# Patient Record
Sex: Female | Born: 1955 | Race: Black or African American | Hispanic: No | Marital: Married | Smoking: Never smoker
Health system: Southern US, Community
[De-identification: ages and names within clinical notes are randomized; demographics above are authoritative.]

## PROBLEM LIST (undated history)

## (undated) DIAGNOSIS — I10 Essential (primary) hypertension: Secondary | ICD-10-CM

## (undated) DIAGNOSIS — L83 Acanthosis nigricans: Secondary | ICD-10-CM

## (undated) DIAGNOSIS — E785 Hyperlipidemia, unspecified: Secondary | ICD-10-CM

## (undated) DIAGNOSIS — K635 Polyp of colon: Secondary | ICD-10-CM

## (undated) DIAGNOSIS — E079 Disorder of thyroid, unspecified: Secondary | ICD-10-CM

## (undated) HISTORY — PX: COLONOSCOPY: SHX174

## (undated) HISTORY — DX: Acanthosis nigricans: L83

## (undated) HISTORY — DX: Hyperlipidemia, unspecified: E78.5

## (undated) HISTORY — PX: FOOT SURGERY: SHX648

## (undated) HISTORY — PX: TUBAL LIGATION: SHX77

## (undated) HISTORY — DX: Polyp of colon: K63.5

## (undated) HISTORY — DX: Essential (primary) hypertension: I10

## (undated) HISTORY — DX: Disorder of thyroid, unspecified: E07.9

---

## 1998-11-08 ENCOUNTER — Other Ambulatory Visit: Admission: RE | Admit: 1998-11-08 | Discharge: 1998-11-08 | Payer: Self-pay | Admitting: Obstetrics and Gynecology

## 2000-02-23 ENCOUNTER — Other Ambulatory Visit: Admission: RE | Admit: 2000-02-23 | Discharge: 2000-02-23 | Payer: Self-pay | Admitting: Obstetrics and Gynecology

## 2000-05-21 ENCOUNTER — Encounter: Admission: RE | Admit: 2000-05-21 | Discharge: 2000-05-21 | Payer: Self-pay | Admitting: Obstetrics and Gynecology

## 2000-05-21 ENCOUNTER — Encounter: Payer: Self-pay | Admitting: Obstetrics and Gynecology

## 2001-02-28 ENCOUNTER — Other Ambulatory Visit: Admission: RE | Admit: 2001-02-28 | Discharge: 2001-02-28 | Payer: Self-pay | Admitting: Obstetrics and Gynecology

## 2001-06-06 ENCOUNTER — Encounter: Admission: RE | Admit: 2001-06-06 | Discharge: 2001-06-06 | Payer: Self-pay | Admitting: Obstetrics and Gynecology

## 2001-06-06 ENCOUNTER — Encounter: Payer: Self-pay | Admitting: Obstetrics and Gynecology

## 2002-06-20 ENCOUNTER — Encounter: Admission: RE | Admit: 2002-06-20 | Discharge: 2002-06-20 | Payer: Self-pay | Admitting: Obstetrics and Gynecology

## 2002-06-20 ENCOUNTER — Encounter: Payer: Self-pay | Admitting: Obstetrics and Gynecology

## 2002-06-27 ENCOUNTER — Other Ambulatory Visit: Admission: RE | Admit: 2002-06-27 | Discharge: 2002-06-27 | Payer: Self-pay | Admitting: Obstetrics and Gynecology

## 2003-07-03 ENCOUNTER — Encounter: Payer: Self-pay | Admitting: Obstetrics and Gynecology

## 2003-07-03 ENCOUNTER — Other Ambulatory Visit: Admission: RE | Admit: 2003-07-03 | Discharge: 2003-07-03 | Payer: Self-pay | Admitting: Obstetrics and Gynecology

## 2003-07-03 ENCOUNTER — Encounter: Admission: RE | Admit: 2003-07-03 | Discharge: 2003-07-03 | Payer: Self-pay | Admitting: Obstetrics and Gynecology

## 2004-05-28 ENCOUNTER — Emergency Department (HOSPITAL_COMMUNITY): Admission: EM | Admit: 2004-05-28 | Discharge: 2004-05-28 | Payer: Self-pay | Admitting: Emergency Medicine

## 2004-07-11 ENCOUNTER — Encounter: Admission: RE | Admit: 2004-07-11 | Discharge: 2004-07-11 | Payer: Self-pay | Admitting: Obstetrics and Gynecology

## 2005-08-02 ENCOUNTER — Encounter: Admission: RE | Admit: 2005-08-02 | Discharge: 2005-08-02 | Payer: Self-pay | Admitting: Obstetrics and Gynecology

## 2006-08-07 ENCOUNTER — Encounter: Admission: RE | Admit: 2006-08-07 | Discharge: 2006-08-07 | Payer: Self-pay | Admitting: Obstetrics and Gynecology

## 2007-08-12 ENCOUNTER — Encounter: Admission: RE | Admit: 2007-08-12 | Discharge: 2007-08-12 | Payer: Self-pay | Admitting: Obstetrics and Gynecology

## 2008-01-16 ENCOUNTER — Ambulatory Visit: Payer: Self-pay | Admitting: Internal Medicine

## 2008-02-03 ENCOUNTER — Ambulatory Visit: Payer: Self-pay | Admitting: Internal Medicine

## 2008-02-03 ENCOUNTER — Encounter: Payer: Self-pay | Admitting: Internal Medicine

## 2008-08-17 ENCOUNTER — Encounter: Admission: RE | Admit: 2008-08-17 | Discharge: 2008-08-17 | Payer: Self-pay | Admitting: Endocrinology

## 2009-08-18 ENCOUNTER — Encounter: Admission: RE | Admit: 2009-08-18 | Discharge: 2009-08-18 | Payer: Self-pay | Admitting: Endocrinology

## 2009-09-07 DIAGNOSIS — E039 Hypothyroidism, unspecified: Secondary | ICD-10-CM | POA: Insufficient documentation

## 2009-09-07 DIAGNOSIS — E785 Hyperlipidemia, unspecified: Secondary | ICD-10-CM | POA: Insufficient documentation

## 2010-08-22 ENCOUNTER — Encounter: Admission: RE | Admit: 2010-08-22 | Discharge: 2010-08-22 | Payer: Self-pay | Admitting: Endocrinology

## 2011-03-27 DIAGNOSIS — I10 Essential (primary) hypertension: Secondary | ICD-10-CM | POA: Insufficient documentation

## 2011-07-31 ENCOUNTER — Other Ambulatory Visit: Payer: Self-pay | Admitting: Endocrinology

## 2011-07-31 DIAGNOSIS — Z1231 Encounter for screening mammogram for malignant neoplasm of breast: Secondary | ICD-10-CM

## 2011-08-28 ENCOUNTER — Ambulatory Visit
Admission: RE | Admit: 2011-08-28 | Discharge: 2011-08-28 | Disposition: A | Discharge status: Home / Self Care | Payer: 59 | Source: Ambulatory Visit | Attending: Endocrinology | Admitting: Endocrinology

## 2011-08-28 DIAGNOSIS — Z1231 Encounter for screening mammogram for malignant neoplasm of breast: Secondary | ICD-10-CM

## 2012-08-06 ENCOUNTER — Other Ambulatory Visit: Payer: Self-pay | Admitting: Endocrinology

## 2012-08-06 DIAGNOSIS — Z1231 Encounter for screening mammogram for malignant neoplasm of breast: Secondary | ICD-10-CM

## 2012-09-02 ENCOUNTER — Ambulatory Visit
Admission: RE | Admit: 2012-09-02 | Discharge: 2012-09-02 | Disposition: A | Discharge status: Home / Self Care | Payer: 59 | Source: Ambulatory Visit | Attending: Endocrinology | Admitting: Endocrinology

## 2012-09-02 DIAGNOSIS — Z1231 Encounter for screening mammogram for malignant neoplasm of breast: Secondary | ICD-10-CM

## 2012-12-11 ENCOUNTER — Encounter: Payer: Self-pay | Admitting: Internal Medicine

## 2013-01-16 ENCOUNTER — Ambulatory Visit (AMBULATORY_SURGERY_CENTER): Payer: 59 | Admitting: *Deleted

## 2013-01-16 VITALS — Ht 67.0 in | Wt 248.0 lb

## 2013-01-16 DIAGNOSIS — Z860100 Personal history of colon polyps, unspecified: Secondary | ICD-10-CM

## 2013-01-16 DIAGNOSIS — Z1211 Encounter for screening for malignant neoplasm of colon: Secondary | ICD-10-CM

## 2013-01-16 MED ORDER — PEG-KCL-NACL-NASULF-NA ASC-C 100 G PO SOLR: ORAL | Status: DC

## 2013-01-16 NOTE — Progress Notes (Signed)
No egg or soy allergy 

## 2013-01-28 ENCOUNTER — Encounter: Payer: Self-pay | Admitting: Internal Medicine

## 2013-01-31 ENCOUNTER — Encounter: Payer: Self-pay | Admitting: Internal Medicine

## 2013-01-31 ENCOUNTER — Encounter: Payer: 59 | Admitting: Internal Medicine

## 2013-01-31 ENCOUNTER — Ambulatory Visit (AMBULATORY_SURGERY_CENTER): Payer: 59 | Admitting: Internal Medicine

## 2013-01-31 VITALS — BP 119/86 | HR 71 | Temp 97.0°F | Resp 22 | Ht 67.0 in | Wt 248.0 lb

## 2013-01-31 DIAGNOSIS — Z86010 Personal history of colonic polyps: Secondary | ICD-10-CM

## 2013-01-31 DIAGNOSIS — Z1211 Encounter for screening for malignant neoplasm of colon: Secondary | ICD-10-CM

## 2013-01-31 MED ORDER — SODIUM CHLORIDE 0.9 % IV SOLN: 500.0000 mL | INTRAVENOUS | Status: DC

## 2013-01-31 NOTE — Progress Notes (Signed)
Patient did not experience any of the following events: a burn prior to discharge; a fall within the facility; wrong site/side/patient/procedure/implant event; or a hospital transfer or hospital admission upon discharge from the facility. (G8907) Patient did not have preoperative order for IV antibiotic SSI prophylaxis. (G8918)  

## 2013-01-31 NOTE — Op Note (Signed)
[facility] [address] [place], [address]   COLONOSCOPY PROCEDURE REPORT  PATIENT: Megan Bowers, Megan Bowers  MR#: [id number] BIRTHDATE: January 25, 1956 , 56  yrs. old GENDER: Female ENDOSCOPIST: Roxy Cedar, MD REFERRED GN:[id number] Program Recall PROCEDURE DATE:  01/31/2013 PROCEDURE:   Colonoscopy, surveillance ASA CLASS:   Class II INDICATIONS:Patient's personal history of adenomatous colon polyps. Index 01-2008 (small TA) MEDICATIONS: MAC sedation, administered by CRNA and propofol (Diprivan) 260mg  IV  DESCRIPTION OF PROCEDURE:   After the risks benefits and alternatives of the procedure were thoroughly explained, informed consent was obtained.  A digital rectal exam revealed no abnormalities of the rectum.   The LB CF-H180AL [id number] and LB CF-Q180AL [id number]  endoscope was introduced through the anus and advanced to the cecum, which was identified by both the appendix and ileocecal valve. No adverse events experienced.   The quality of the prep was adequate, using MoviPrep  The instrument was then slowly withdrawn as the colon was fully examined.      COLON FINDINGS: A normal appearing cecum, ileocecal valve, and appendiceal orifice were identified.  The ascending, hepatic flexure, transverse, splenic flexure, descending, sigmoid colon and rectum appeared unremarkable.  No polyps or cancers were seen. Retroflexed views revealed internal hemorrhoids. The time to cecum=3 minutes 52 seconds.  Withdrawal time=11 minutes 41 seconds. The scope was withdrawn and the procedure completed. COMPLICATIONS: There were no complications.  ENDOSCOPIC IMPRESSION: 1. Normal colon  RECOMMENDATIONS: 1. Continue current colorectal screening recommendations  with a repeat colonoscopy in 10 years.   eSigned:  Roxy Cedar, MD 01/31/2013 9:11 AM   cc: Adrian Prince, MD and The Patient

## 2013-01-31 NOTE — Patient Instructions (Addendum)
YOU HAD AN ENDOSCOPIC PROCEDURE TODAY AT THE [facility]: Refer to the procedure report that was given to you for any specific questions about what was found during the examination.  If the procedure report does not answer your questions, please call your gastroenterologist to clarify.  If you requested that your care partner not be given the details of your procedure findings, then the procedure report has been included in a sealed envelope for you to review at your convenience later.  YOU SHOULD EXPECT: Some feelings of bloating in the abdomen. Passage of more gas than usual.  Walking can help get rid of the air that was put into your GI tract during the procedure and reduce the bloating. If you had a lower endoscopy (such as a colonoscopy or flexible sigmoidoscopy) you may notice spotting of blood in your stool or on the toilet paper. If you underwent a bowel prep for your procedure, then you may not have a normal bowel movement for a few days.  DIET: Your first meal following the procedure should be a light meal and then it is ok to progress to your normal diet.  A half-sandwich or bowl of soup is an example of a good first meal.  Heavy or fried foods are harder to digest and may make you feel nauseous or bloated.  Likewise meals heavy in dairy and vegetables can cause extra gas to form and this can also increase the bloating.  Drink plenty of fluids but you should avoid alcoholic beverages for 24 hours.  ACTIVITY: Your care partner should take you home directly after the procedure.  You should plan to take it easy, moving slowly for the rest of the day.  You can resume normal activity the day after the procedure however you should NOT DRIVE or use heavy machinery for 24 hours (because of the sedation medicines used during the test).    SYMPTOMS TO REPORT IMMEDIATELY: A gastroenterologist can be reached at any hour.  During normal business hours, 8:30 AM to 5:00 PM Monday through Friday,  call [phone].  After hours and on weekends, please call the GI answering service at [phone] who will take a message and have the physician on call contact you.   Following lower endoscopy (colonoscopy or flexible sigmoidoscopy):  Excessive amounts of blood in the stool  Significant tenderness or worsening of abdominal pains  Swelling of the abdomen that is new, acute  Fever of 100F or higher  FOLLOW UP: Our staff will call the home number listed on your records the next business day following your procedure to check on you and address any questions or concerns that you may have at that time regarding the information given to you following your procedure. This is a courtesy call and so if there is no answer at the home number and we have not heard from you through the emergency physician on call, we will assume that you have returned to your regular daily activities without incident.  SIGNATURES/CONFIDENTIALITY: You and/or your care partner have signed paperwork which will be entered into your electronic medical record.  These signatures attest to the fact that that the information above on your After Visit Summary has been reviewed and is understood.  Full responsibility of the confidentiality of this discharge information lies with you and/or your care-partner.  Continue your usual medications  Follow up in 10 years

## 2013-02-03 ENCOUNTER — Telehealth: Payer: Self-pay | Admitting: *Deleted

## 2013-02-03 NOTE — Telephone Encounter (Signed)
  Follow up Call-  Call back number 01/31/2013  Post procedure Call Back phone  # [phone]  Permission to leave phone message Yes     Patient questions:  Do you have a fever, pain , or abdominal swelling? no Pain Score  0 *  Have you tolerated food without any problems? yes  Have you been able to return to your normal activities? yes  Do you have any questions about your discharge instructions: Diet   no Medications  no Follow up visit  no  Do you have questions or concerns about your Care? no  Actions: * If pain score is 4 or above: No action needed, pain <4.

## 2013-05-20 ENCOUNTER — Encounter: Payer: Self-pay | Admitting: Podiatry

## 2013-05-20 ENCOUNTER — Ambulatory Visit (INDEPENDENT_AMBULATORY_CARE_PROVIDER_SITE_OTHER): Payer: 59 | Admitting: Podiatry

## 2013-05-20 DIAGNOSIS — M7660 Achilles tendinitis, unspecified leg: Secondary | ICD-10-CM

## 2013-05-20 DIAGNOSIS — M7662 Achilles tendinitis, left leg: Secondary | ICD-10-CM

## 2013-05-20 DIAGNOSIS — L84 Corns and callosities: Secondary | ICD-10-CM

## 2013-05-20 NOTE — Progress Notes (Signed)
Subjective: 57 year old female presents complaining of left heel pain and problem with callus on right foot. She is using 56 year old LRB orthotics. Patient stands on feet all day at work.  Objective: Callus under the 2nd and 3rd MPJ right. Pain and tenderness over posterio-inferior aspect of left heel after been on feet.  No acute edema or erythema noted. No pain with range of motion. All neurovascular status are within normal. Positive for mild tightness of Achilles tendon bilaterial.   Subjective: Achilles tendonitis left heel. Plantar callus right under 2nd and 3rd MPJ.  Plan: Calluses trimmed. Both Orthotics have worn out at plantar lateral surface. Added with varus posting with 1/4" felt pad at plantar lateral wedge of the heel.  If this is not effective, will try new pair of Orthotics.

## 2013-08-07 ENCOUNTER — Other Ambulatory Visit: Payer: Self-pay

## 2013-08-07 DIAGNOSIS — Z1231 Encounter for screening mammogram for malignant neoplasm of breast: Secondary | ICD-10-CM

## 2013-09-04 ENCOUNTER — Ambulatory Visit: Payer: 59

## 2013-09-29 ENCOUNTER — Ambulatory Visit
Admission: RE | Admit: 2013-09-29 | Discharge: 2013-09-29 | Disposition: A | Discharge status: Home / Self Care | Payer: 59 | Source: Ambulatory Visit

## 2013-09-29 DIAGNOSIS — Z1231 Encounter for screening mammogram for malignant neoplasm of breast: Secondary | ICD-10-CM

## 2014-10-13 ENCOUNTER — Other Ambulatory Visit: Payer: Self-pay

## 2014-10-13 DIAGNOSIS — Z1231 Encounter for screening mammogram for malignant neoplasm of breast: Secondary | ICD-10-CM

## 2014-10-19 ENCOUNTER — Ambulatory Visit: Payer: 59

## 2014-10-19 ENCOUNTER — Ambulatory Visit
Admission: RE | Admit: 2014-10-19 | Discharge: 2014-10-19 | Disposition: A | Discharge status: Home / Self Care | Payer: No Typology Code available for payment source | Source: Ambulatory Visit

## 2014-10-19 DIAGNOSIS — Z1231 Encounter for screening mammogram for malignant neoplasm of breast: Secondary | ICD-10-CM

## 2015-09-20 ENCOUNTER — Ambulatory Visit (INDEPENDENT_AMBULATORY_CARE_PROVIDER_SITE_OTHER): Payer: PRIVATE HEALTH INSURANCE | Admitting: Podiatry

## 2015-09-20 ENCOUNTER — Encounter: Payer: Self-pay | Admitting: Podiatry

## 2015-09-20 VITALS — Ht 66.5 in | Wt 240.0 lb

## 2015-09-20 DIAGNOSIS — M7660 Achilles tendinitis, unspecified leg: Secondary | ICD-10-CM

## 2015-09-20 DIAGNOSIS — M21969 Unspecified acquired deformity of unspecified lower leg: Secondary | ICD-10-CM

## 2015-09-20 DIAGNOSIS — M216X9 Other acquired deformities of unspecified foot: Secondary | ICD-10-CM

## 2015-09-20 DIAGNOSIS — M7662 Achilles tendinitis, left leg: Secondary | ICD-10-CM

## 2015-09-20 MED ORDER — LIDOCAINE 5 % EX PTCH: 1.0000 | MEDICATED_PATCH | CUTANEOUS | Status: DC

## 2015-09-20 NOTE — Progress Notes (Signed)
Subjective: 59 year old female presents complaining of pain at the back of left heel, inferior, posterior medial  x 9 months.  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   Objective: Dermatologic: No open skin lesions. No edema or erythema at the affected area. Neurovascular status are within normal other than localized posterior heel pain left. Orthopedic:  Hypermobile first ray with forefoot varus, and STJ hyperpronation with weight bearing bilateral.  Pain in left posterior heel.  X-ray: AP view show very short first ray (-10)/ long 2nd ray bilateral.  Positive of elevated first ray in lateral view. Post surgical internal fixation screw 3rd metatarsal bilateral.   Assessment: Achilles tendonitis left. Hypermobile first ray with forefoot varus bilateral. STJ hyperpronation bilateral.   Plan: Reviewed clinical findings and available treatment options. Both feet casted for orthotics. Rx. Lidoderm patch to use as needed.

## 2015-09-20 NOTE — Patient Instructions (Signed)
Seen for pain in left posterior heel.  Lidoderm patch prescribed. Need custom orthotics.

## 2015-09-21 ENCOUNTER — Inpatient Hospital Stay: Admission: RE | Admit: 2015-09-21 | Payer: Self-pay | Source: Ambulatory Visit

## 2015-09-21 ENCOUNTER — Other Ambulatory Visit: Payer: Self-pay

## 2015-09-21 DIAGNOSIS — M21969 Unspecified acquired deformity of unspecified lower leg: Secondary | ICD-10-CM | POA: Insufficient documentation

## 2015-09-21 DIAGNOSIS — M216X9 Other acquired deformities of unspecified foot: Secondary | ICD-10-CM | POA: Insufficient documentation

## 2015-09-22 ENCOUNTER — Telehealth: Payer: Self-pay | Admitting: *Deleted

## 2015-09-22 MED ORDER — DICLOFENAC SODIUM 1 % TD GEL: 2.0000 g | Freq: Four times a day (QID) | TRANSDERMAL | Status: DC

## 2015-09-22 NOTE — Telephone Encounter (Signed)
09/22/2015 Dr. Caffie Pinto, Verdene Lennert called and said she couldn't get any of the pain cream /patch. They wouldn't break a box to give her any, can you give her anything else she said.

## 2015-10-05 ENCOUNTER — Other Ambulatory Visit: Payer: Self-pay

## 2015-10-05 DIAGNOSIS — Z1231 Encounter for screening mammogram for malignant neoplasm of breast: Secondary | ICD-10-CM

## 2015-11-11 ENCOUNTER — Ambulatory Visit
Admission: RE | Admit: 2015-11-11 | Discharge: 2015-11-11 | Disposition: A | Discharge status: Home / Self Care | Payer: No Typology Code available for payment source | Source: Ambulatory Visit

## 2015-11-11 DIAGNOSIS — Z1231 Encounter for screening mammogram for malignant neoplasm of breast: Secondary | ICD-10-CM

## 2016-10-17 ENCOUNTER — Other Ambulatory Visit: Payer: Self-pay | Admitting: Endocrinology

## 2016-10-17 DIAGNOSIS — Z1231 Encounter for screening mammogram for malignant neoplasm of breast: Secondary | ICD-10-CM

## 2016-11-16 ENCOUNTER — Ambulatory Visit: Payer: No Typology Code available for payment source

## 2016-11-21 ENCOUNTER — Ambulatory Visit
Admission: RE | Admit: 2016-11-21 | Discharge: 2016-11-21 | Disposition: A | Discharge status: Home / Self Care | Payer: No Typology Code available for payment source | Source: Ambulatory Visit | Attending: Endocrinology | Admitting: Endocrinology

## 2016-11-21 DIAGNOSIS — Z1231 Encounter for screening mammogram for malignant neoplasm of breast: Secondary | ICD-10-CM

## 2017-07-17 ENCOUNTER — Ambulatory Visit (INDEPENDENT_AMBULATORY_CARE_PROVIDER_SITE_OTHER): Payer: No Typology Code available for payment source | Admitting: Internal Medicine

## 2017-07-17 ENCOUNTER — Encounter: Payer: Self-pay | Admitting: Internal Medicine

## 2017-07-17 VITALS — BP 128/72 | HR 84 | Ht 66.5 in | Wt 236.0 lb

## 2017-07-17 DIAGNOSIS — Z86010 Personal history of colonic polyps: Secondary | ICD-10-CM | POA: Diagnosis not present

## 2017-07-17 DIAGNOSIS — R1310 Dysphagia, unspecified: Secondary | ICD-10-CM

## 2017-07-17 DIAGNOSIS — K219 Gastro-esophageal reflux disease without esophagitis: Secondary | ICD-10-CM | POA: Diagnosis not present

## 2017-07-17 DIAGNOSIS — K8070 Calculus of gallbladder and bile duct without cholecystitis without obstruction: Secondary | ICD-10-CM

## 2017-07-17 NOTE — Progress Notes (Signed)
HISTORY OF PRESENT ILLNESS:  Megan Bowers is a 61 y.o. female with past medical history as listed below who is referred today by her primary care provider Dr. Forde Dandy with chief complaint of reflux disease with significant regurgitation and mild dysphagia. The patient was last seen in this facility May 2014 when she underwent surveillance colonoscopy for history of diminutive colonic adenoma. Examination was normal. Follow-up in 10 years recommended. She states she was in her usual state of health until approximately 5 months ago when she began to develop problems with significant regurgitation. Symptoms were worse late evening. Occasional mild indigestion as well. She does mention some very mild intermittent solid food dysphagia item such as apples with skin. She was evaluated by Dr. Forde Dandy in August and placed on over-the-counter omeprazole for approximately 3 weeks. Symptoms seemed to improve. Minimal recurrence after coming off PPI. She presents now for evaluation. Patient has had steady weight gain over time. Current BMI 37.52. GI review of systems is otherwise negative. Review of outside laboratories from 04/03/2017 finds a unremarkable CBC and comprehensive metabolic panel. Hemoglobin 13.6. Previous radiology August 2005 for right-sided abdominal pain reveals cholelithiasis and hepatomegaly  REVIEW OF SYSTEMS:  All non-GI ROS negative entirely without exception  Past Medical History:  Diagnosis Date  . Acanthosis nigricans   . Colon polyps   . Hyperlipidemia   . Hypertension   . Thyroid disease    hypothryroid    Past Surgical History:  Procedure Laterality Date  . COLONOSCOPY    . FOOT SURGERY    . TUBAL LIGATION      Social History Megan Bowers  reports that she has never smoked. She has never used smokeless tobacco. She reports that she does not drink alcohol or use drugs.  family history is not on file.  No Known Allergies     PHYSICAL EXAMINATION: Vital  signs: BP 128/72   Pulse 84   Ht 5' 6.5" (1.689 m)   Wt 236 lb (107 kg)   BMI 37.52 kg/m   Constitutional: generally well-appearing, no acute distress Psychiatric: alert and oriented x3, cooperative Eyes: extraocular movements intact, anicteric, conjunctiva pink Mouth: oral pharynx moist, no lesions. No thrush Neck: suppleWithout thyromegaly Lymph: no supraclavicular lymphadenopathy Cardiovascular: heart regular rate and rhythm, no murmur Lungs: clear to auscultation bilaterally Abdomen: soft, obese, nontender, nondistended, no obvious ascites, no peritoneal signs, normal bowel sounds, no organomegaly. No succussion splash Rectal:Omitted Extremities: no clubbing, cyanosis, or lower extremity edema bilaterally Skin: no lesions on visible extremities Neuro: No focal deficits. Cranial nerves intact  ASSESSMENT:  #1. GERD with significant regurgitant component #2. Mild intermittent solid food dysphagia. Rule out peptic stricture #3. Morbid obesity #4. History of diminutive tubular adenoma on screening colonoscopy. Surveillance up-to-date #5. Cholelithiasis on ultrasound  PLAN:  #1. Reflux precautions with attention to weight loss. Reviewed #2. Literature provided and reviewed on GERD #3. PPI on demand, such as omeprazole 20 mg daily #4. Upper endoscopy with possible esophageal dilation.The nature of the procedure, as well as the risks, benefits, and alternatives were carefully and thoroughly reviewed with the patient. Ample time for discussion and questions allowed. The patient understood, was satisfied, and agreed to proceed. #5. Surveillance colonoscopy around 2024 #6. Ongoing general medical care with Dr. Forde Dandy  A copy of this consultation note has been sent to Dr. Forde Dandy

## 2017-07-17 NOTE — Patient Instructions (Signed)

## 2017-09-04 ENCOUNTER — Encounter: Payer: Self-pay | Admitting: Internal Medicine

## 2017-09-04 ENCOUNTER — Other Ambulatory Visit: Payer: Self-pay

## 2017-09-04 ENCOUNTER — Ambulatory Visit (AMBULATORY_SURGERY_CENTER): Payer: No Typology Code available for payment source | Admitting: Internal Medicine

## 2017-09-04 VITALS — BP 140/84 | HR 67 | Temp 96.8°F | Resp 12 | Ht 66.5 in | Wt 236.0 lb

## 2017-09-04 DIAGNOSIS — K253 Acute gastric ulcer without hemorrhage or perforation: Secondary | ICD-10-CM | POA: Diagnosis not present

## 2017-09-04 DIAGNOSIS — R1310 Dysphagia, unspecified: Secondary | ICD-10-CM | POA: Diagnosis present

## 2017-09-04 DIAGNOSIS — K219 Gastro-esophageal reflux disease without esophagitis: Secondary | ICD-10-CM

## 2017-09-04 DIAGNOSIS — K2100 Gastro-esophageal reflux disease with esophagitis, without bleeding: Secondary | ICD-10-CM

## 2017-09-04 MED ORDER — SODIUM CHLORIDE 0.9 % IV SOLN: 500.0000 mL | INTRAVENOUS | Status: DC

## 2017-09-04 MED ORDER — OMEPRAZOLE 20 MG PO CPDR: 20.0000 mg | DELAYED_RELEASE_CAPSULE | Freq: Every day | ORAL | 11 refills | Status: DC

## 2017-09-04 NOTE — Patient Instructions (Signed)
YOU HAD AN ENDOSCOPIC PROCEDURE TODAY AT [facility]:   Refer to the procedure report that was given to you for any specific questions about what was found during the examination.  If the procedure report does not answer your questions, please call your gastroenterologist to clarify.  If you requested that your care partner not be given the details of your procedure findings, then the procedure report has been included in a sealed envelope for you to review at your convenience later.  YOU SHOULD EXPECT: Some feelings of bloating in the abdomen. Passage of more gas than usual.  Walking can help get rid of the air that was put into your GI tract during the procedure and reduce the bloating. If you had a lower endoscopy (such as a colonoscopy or flexible sigmoidoscopy) you may notice spotting of blood in your stool or on the toilet paper. If you underwent a bowel prep for your procedure, you may not have a normal bowel movement for a few days.  Please Note:  You might notice some irritation and congestion in your nose or some drainage.  This is from the oxygen used during your procedure.  There is no need for concern and it should clear up in a day or so.  SYMPTOMS TO REPORT IMMEDIATELY:   Following upper endoscopy (EGD)  Vomiting of blood or coffee ground material  New chest pain or pain under the shoulder blades  Painful or persistently difficult swallowing  New shortness of breath  Fever of 100F or higher  Black, tarry-looking stools  For urgent or emergent issues, a gastroenterologist can be reached at any hour by calling [phone].   DIET:  We do recommend a small meal at first, but then you may proceed to your regular diet.  Drink plenty of fluids but you should avoid alcoholic beverages for 24 hours.  MEDICATIONS: Start Omeprazole 20 mg by mouth daily (30 tablets with 11 refills RX sent to your pharmacy). Please stay on your other current medications until your follow  up with Dr. Henrene Pastor in about 3 months.  FOLLOW: Please call and schedule an appointment to follow up with Dr. Henrene Pastor in about 3 months.  Please see handouts given to you by your recovery nurse.  ACTIVITY:  You should plan to take it easy for the rest of today and you should NOT DRIVE or use heavy machinery until tomorrow (because of the sedation medicines used during the test).    FOLLOW UP: Our staff will call the number listed on your records the next business day following your procedure to check on you and address any questions or concerns that you may have regarding the information given to you following your procedure. If we do not reach you, we will leave a message.  However, if you are feeling well and you are not experiencing any problems, there is no need to return our call.  We will assume that you have returned to your regular daily activities without incident.  If any biopsies were taken you will be contacted by phone or by letter within the next 1-3 weeks.  Please call us at [phone] if you have not heard about the biopsies in 3 weeks.   Thank you for allowing [place] to provide for your healthcare needs today.  SIGNATURES/CONFIDENTIALITY: You and/or your care partner have signed paperwork which will be entered into your electronic medical record.  These signatures attest to the fact that that the information above on  your After Visit Summary has been reviewed and is understood.  Full responsibility of the confidentiality of this discharge information lies with you and/or your care-partner. 

## 2017-09-04 NOTE — Op Note (Signed)
[facility] Patient Name: Megan Bowers Procedure Date: 09/04/2017 10:03 AM MRN: [id number] Endoscopist: Docia Chuck. Henrene Pastor , MD Age: 61 Referring MD:  Date of Birth: 03-Jun-1956 Gender: Female Account #: [id number] Procedure:                Upper GI endoscopy, with biopsies Indications:              Dysphagia, Esophageal reflux, Regurgitation Medicines:                Monitored Anesthesia Care Procedure:                Pre-Anesthesia Assessment:                           - Prior to the procedure, a History and Physical                            was performed, and patient medications and                            allergies were reviewed. The patient's tolerance of                            previous anesthesia was also reviewed. The risks                            and benefits of the procedure and the sedation                            options and risks were discussed with the patient.                            All questions were answered, and informed consent                            was obtained. Prior Anticoagulants: The patient has                            taken no previous anticoagulant or antiplatelet                            agents. ASA Grade Assessment: II - A patient with                            mild systemic disease. After reviewing the risks                            and benefits, the patient was deemed in                            satisfactory condition to undergo the procedure.                           After obtaining informed consent, the endoscope was  passed under direct vision. Throughout the                            procedure, the patient's blood pressure, pulse, and                            oxygen saturations were monitored continuously. The                            Endoscope was introduced through the mouth, and                            advanced to the second part of duodenum. The upper           GI endoscopy was accomplished without difficulty.                            The patient tolerated the procedure well. Scope In: Scope Out: Findings:                 LA Grade A (one or more mucosal breaks less than 5                            mm, not extending between tops of 2 mucosal folds)                            esophagitis was found.                           The esophagus was otherwise normal.                           Multiple small erosions were found in the                            prepyloric region of the stomach. Biopsies were                            taken with a cold forceps for Helicobacter pylori                            testing using CLOtest.                           The stomach was otherwise normal.                           The examined duodenum was normal.                           The cardia and gastric fundus were normal on                            retroflexion. Complications:            No immediate complications. Estimated Blood Loss:     Estimated  blood loss: none. Impression:               1. GERD with endoscopic evidence of esophagitis                            (mild) with erosion and edema                           2. Prepyloric erosions. Status post CLO biopsy                           3. Otherwise normal EGD. Recommendation:           1. Reflux precautions. Please review the                            information provided by endoscopy nurse                           2. Weight loss. This will help your problems with                            regurgitation                           3. Prescribe omeprazole 20 mg daily; #30; 11 refills                           4. Follow-up CLO biopsy and treat if positive                           5. Please make follow-up office appointment with                            Dr. Henrene Pastor in about 3 months. Stay on your                            medication until that time Docia Chuck. Henrene Pastor, MD 09/04/2017 10:27:38  AM This report has been signed electronically.

## 2017-09-04 NOTE — Progress Notes (Signed)
Called to room to assist during endoscopic procedure.  Patient ID and intended procedure confirmed with present staff. Received instructions for my participation in the procedure from the performing physician.  

## 2017-09-04 NOTE — Progress Notes (Signed)
Dental advisory given to patient 

## 2017-09-04 NOTE — Progress Notes (Signed)
A/ox3 pleased with MAC, report to [facility]

## 2017-09-05 ENCOUNTER — Telehealth: Payer: Self-pay | Admitting: *Deleted

## 2017-09-05 LAB — HELICOBACTER PYLORI SCREEN-BIOPSY: UREASE: NEGATIVE

## 2017-09-05 NOTE — Telephone Encounter (Signed)
  Follow up Call-  Call back number 09/04/2017  Post procedure Call Back phone  # [phone]  Permission to leave phone message Yes  Some recent data might be hidden     Patient questions:  Do you have a fever, pain , or abdominal swelling? No. Pain Score  0 *  Have you tolerated food without any problems? Yes.    Have you been able to return to your normal activities? Yes.    Do you have any questions about your discharge instructions: Diet   No. Medications  No. Follow up visit  No.  Do you have questions or concerns about your Care? No.  Actions: * If pain score is 4 or above: No action needed, pain <4.

## 2017-10-17 ENCOUNTER — Other Ambulatory Visit: Payer: Self-pay | Admitting: Endocrinology

## 2017-10-17 DIAGNOSIS — Z1231 Encounter for screening mammogram for malignant neoplasm of breast: Secondary | ICD-10-CM

## 2017-11-26 ENCOUNTER — Ambulatory Visit
Admission: RE | Admit: 2017-11-26 | Discharge: 2017-11-26 | Disposition: A | Discharge status: Home / Self Care | Payer: No Typology Code available for payment source | Source: Ambulatory Visit | Attending: Endocrinology | Admitting: Endocrinology

## 2017-11-26 DIAGNOSIS — Z1231 Encounter for screening mammogram for malignant neoplasm of breast: Secondary | ICD-10-CM

## 2017-12-17 ENCOUNTER — Encounter: Payer: Self-pay | Admitting: Internal Medicine

## 2017-12-17 ENCOUNTER — Ambulatory Visit (INDEPENDENT_AMBULATORY_CARE_PROVIDER_SITE_OTHER): Payer: No Typology Code available for payment source | Admitting: Internal Medicine

## 2017-12-17 VITALS — BP 138/82 | HR 78 | Ht 66.5 in | Wt 223.2 lb

## 2017-12-17 DIAGNOSIS — Z860100 Personal history of colon polyps, unspecified: Secondary | ICD-10-CM

## 2017-12-17 DIAGNOSIS — K219 Gastro-esophageal reflux disease without esophagitis: Secondary | ICD-10-CM

## 2017-12-17 DIAGNOSIS — R1310 Dysphagia, unspecified: Secondary | ICD-10-CM

## 2017-12-17 MED ORDER — OMEPRAZOLE 20 MG PO CPDR: 20.0000 mg | DELAYED_RELEASE_CAPSULE | Freq: Every day | ORAL | 11 refills | Status: DC

## 2017-12-17 NOTE — Progress Notes (Signed)
HISTORY OF PRESENT ILLNESS:  Megan Bowers is a 62 y.o. female who was evaluated 07/17/2017 with GERD, regurgitation, and mild dysphagia. See that dictation for details. She subsequently underwent upper endoscopy 09/04/2017. Examination revealed reflux esophagitis with erosion and edema as well as prepyloric erosions. Testing for Helicobacter pylori was negative. She was placed on omeprazole 20 mg daily. No significant stricture, thus no dilation performed. She presents today for follow-up. She is pleased report 13 pound weight loss. No heartburn or indigestion on omeprazole. She still is experiencing intermittent problems with regurgitation. No significant dysphagia. No medication side effects.  REVIEW OF SYSTEMS:  All non-GI ROS negative unless otherwise stated in the history of present illness  Past Medical History:  Diagnosis Date  . Acanthosis nigricans   . Colon polyps   . Hyperlipidemia   . Hypertension   . Thyroid disease    hypothryroid    Past Surgical History:  Procedure Laterality Date  . COLONOSCOPY    . FOOT SURGERY    . TUBAL LIGATION      Social History Levi Crass  reports that  has never smoked. she has never used smokeless tobacco. She reports that she does not drink alcohol or use drugs.  family history is not on file.  No Known Allergies     PHYSICAL EXAMINATION: Vital signs: BP 138/82   Pulse 78   Ht 5' 6.5" (1.689 m)   Wt 223 lb 3.2 oz (101.2 kg)   SpO2 98%   BMI 35.49 kg/m   Constitutional: generally well-appearing, no acute distress Psychiatric: alert and oriented x3, cooperative Eyes: extraocular movements intact, anicteric, conjunctiva pink Mouth: oral pharynx moist, no lesions Neck: supple no lymphadenopathy Cardiovascular: heart regular rate and rhythm, no murmur Lungs: clear to auscultation bilaterally Abdomen: soft,  Obese,nontender, nondistended, no obvious ascites, no peritoneal signs, normal bowel sounds, no  organomegaly Rectal:omitted Extremities: no clubbing cyanosis or lower extremity edema bilaterally Skin: no lesions on visible extremities Neuro: No focal deficits. No asterixis.    ASSESSMENT:  #1. GERD with endoscopic evidence of esophagitis. All symptoms improved except for regurgitation #2. Obesity. The patient is working toward weight loss #3. History of diminutive adenoma 2009. Normal colonoscopy 2014   PLAN:  #1. Reflux precautions. This is very important in terms of the regurgitation component. We talked about meal size, not lying down after eating, and not eating too close to bedtime. We also discussed the importance of ongoing weight reduction #2. Continue omeprazole 20 mg daily. Prescription refilled multiple times #3. Surveillance colonoscopy around 2024 #4. Routine office follow-up one year #5. Ongoing general medical care with Dr. Forde Dandy

## 2017-12-17 NOTE — Patient Instructions (Signed)
We have sent the following medications to your pharmacy for you to pick up at your convenience:  Omeprazole.  Please follow up in one year  

## 2017-12-18 ENCOUNTER — Telehealth: Payer: Self-pay | Admitting: Internal Medicine

## 2017-12-18 ENCOUNTER — Other Ambulatory Visit: Payer: Self-pay

## 2017-12-18 DIAGNOSIS — R1310 Dysphagia, unspecified: Secondary | ICD-10-CM

## 2017-12-18 NOTE — Telephone Encounter (Signed)
Increase omeprazole to 40 mg daily. Chew food well with small bites. Arrange barium swallow with tablet to rule out significant stricture needing dilation. Thanks

## 2017-12-18 NOTE — Telephone Encounter (Signed)
Spoke with pt and she is aware. Barium Swallow scheduled at [facility] 12/21/17@9am , pt to arrive there at 8:45am. Pt to be NPO after midnight. Pt aware of appt and recommendations.

## 2017-12-18 NOTE — Telephone Encounter (Signed)
Pt states that she was doing fine when she saw Dr. Henrene Pastor in the office yesterday but when she ate dinner a piece of meat got lodged in her throat. She started gagging and tried to drink some water and the meat came out. Also reports when she ate breakfast this morning her egg and bacon seemed to get lodged also. Pt just wonders if the dilation worked because at the time she had a lot of inflammation. Please advise.

## 2017-12-21 ENCOUNTER — Ambulatory Visit (HOSPITAL_COMMUNITY)
Admission: RE | Admit: 2017-12-21 | Discharge: 2017-12-21 | Disposition: A | Discharge status: Home / Self Care | Payer: No Typology Code available for payment source | Source: Ambulatory Visit | Attending: Internal Medicine | Admitting: Internal Medicine

## 2017-12-21 DIAGNOSIS — K219 Gastro-esophageal reflux disease without esophagitis: Secondary | ICD-10-CM | POA: Diagnosis not present

## 2017-12-21 DIAGNOSIS — R1310 Dysphagia, unspecified: Secondary | ICD-10-CM

## 2017-12-21 DIAGNOSIS — K224 Dyskinesia of esophagus: Secondary | ICD-10-CM | POA: Insufficient documentation

## 2017-12-26 ENCOUNTER — Telehealth: Payer: Self-pay | Admitting: Internal Medicine

## 2017-12-27 MED ORDER — OMEPRAZOLE 40 MG PO CPDR: 40.0000 mg | DELAYED_RELEASE_CAPSULE | Freq: Every day | ORAL | 3 refills | Status: DC

## 2017-12-27 NOTE — Telephone Encounter (Signed)
Resent new dosage of Omeprazole 40mg 

## 2018-02-20 ENCOUNTER — Other Ambulatory Visit: Payer: Self-pay | Admitting: Endocrinology

## 2018-02-20 DIAGNOSIS — M7989 Other specified soft tissue disorders: Secondary | ICD-10-CM

## 2018-02-21 ENCOUNTER — Ambulatory Visit
Admission: RE | Admit: 2018-02-21 | Discharge: 2018-02-21 | Disposition: A | Discharge status: Home / Self Care | Payer: No Typology Code available for payment source | Source: Ambulatory Visit | Attending: Endocrinology | Admitting: Endocrinology

## 2018-02-21 DIAGNOSIS — M7989 Other specified soft tissue disorders: Secondary | ICD-10-CM

## 2018-04-16 DIAGNOSIS — M899 Disorder of bone, unspecified: Secondary | ICD-10-CM | POA: Insufficient documentation

## 2018-04-16 DIAGNOSIS — K219 Gastro-esophageal reflux disease without esophagitis: Secondary | ICD-10-CM | POA: Insufficient documentation

## 2018-04-16 DIAGNOSIS — D126 Benign neoplasm of colon, unspecified: Secondary | ICD-10-CM | POA: Insufficient documentation

## 2018-10-22 ENCOUNTER — Other Ambulatory Visit: Payer: Self-pay | Admitting: Endocrinology

## 2018-10-22 DIAGNOSIS — Z1231 Encounter for screening mammogram for malignant neoplasm of breast: Secondary | ICD-10-CM

## 2018-11-28 ENCOUNTER — Ambulatory Visit
Admission: RE | Admit: 2018-11-28 | Discharge: 2018-11-28 | Disposition: A | Discharge status: Home / Self Care | Payer: No Typology Code available for payment source | Source: Ambulatory Visit | Attending: Endocrinology | Admitting: Endocrinology

## 2018-11-28 DIAGNOSIS — Z1231 Encounter for screening mammogram for malignant neoplasm of breast: Secondary | ICD-10-CM

## 2019-10-21 ENCOUNTER — Other Ambulatory Visit: Payer: Self-pay | Admitting: Endocrinology

## 2019-10-21 DIAGNOSIS — Z1231 Encounter for screening mammogram for malignant neoplasm of breast: Secondary | ICD-10-CM

## 2019-11-22 ENCOUNTER — Ambulatory Visit: Payer: No Typology Code available for payment source | Attending: Internal Medicine

## 2019-11-22 DIAGNOSIS — Z23 Encounter for immunization: Secondary | ICD-10-CM

## 2019-11-22 IMAGING — US US PELVIS LIMITED
1 series · 14 of 14 positions shown · non-contrast
Comparison: None.

CLINICAL DATA: Soft tissue mass in the left buttocks

EXAM:
LIMITED ULTRASOUND OF PELVIS
TECHNIQUE: Limited transabdominal ultrasound examination of the pelvis was
performed.

[Series 1: us pelvis limited · 0.06mm/px · 14 acquisitions, 14 frames shown]
[im 1/14]
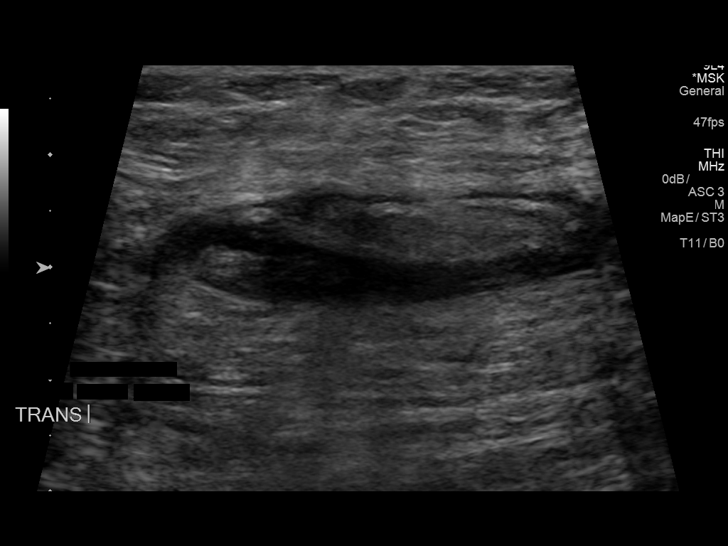
[im 2/14]
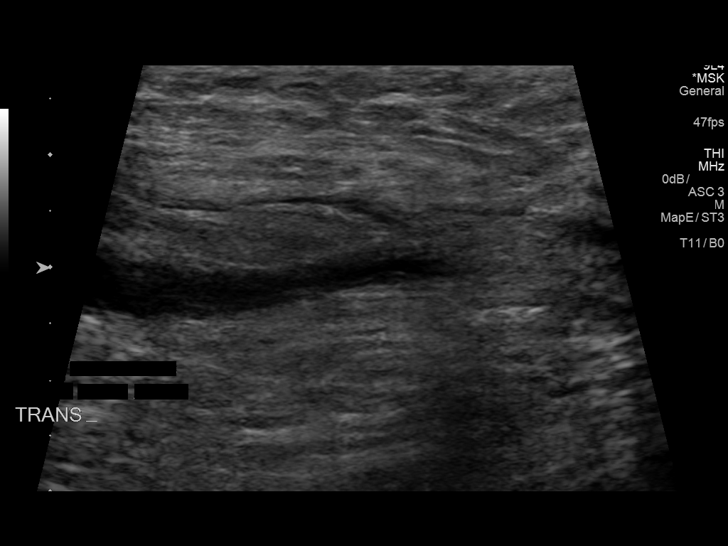
[im 3/14]
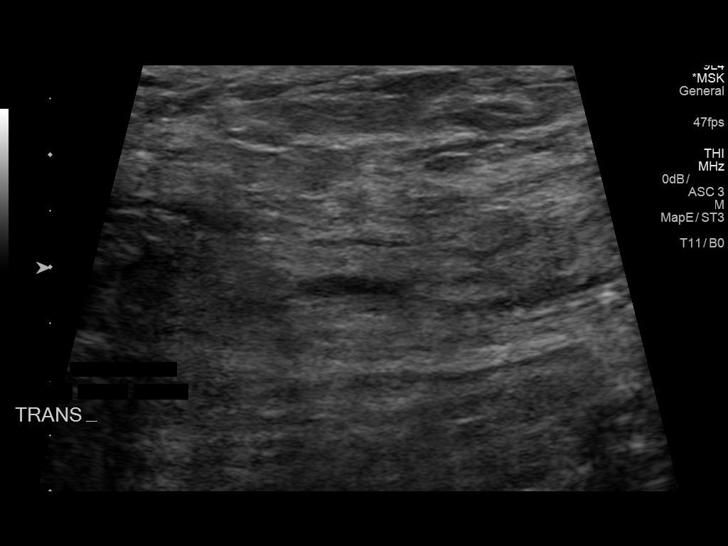
[im 4/14]
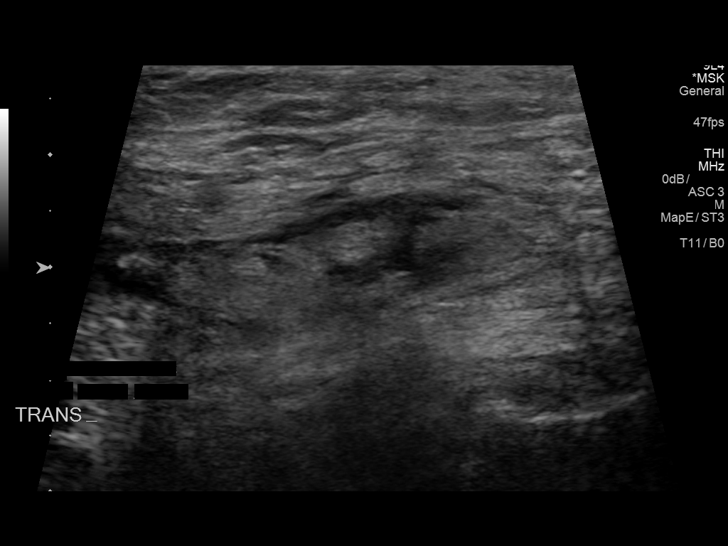
[im 5/14]
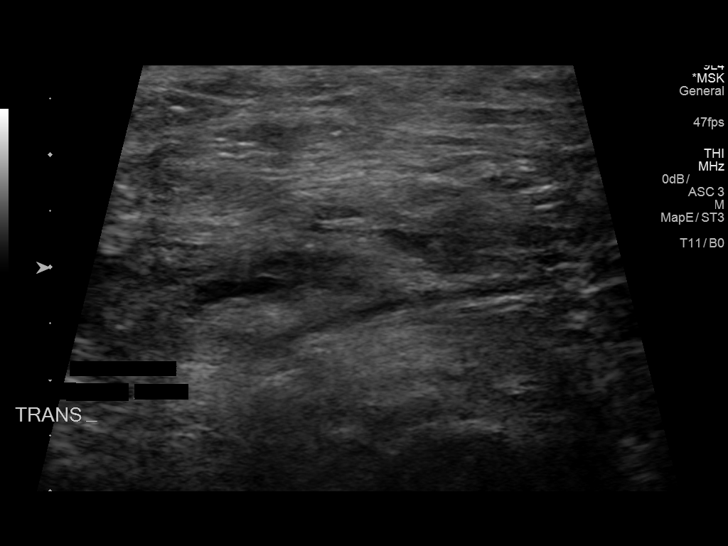
[im 6/14]
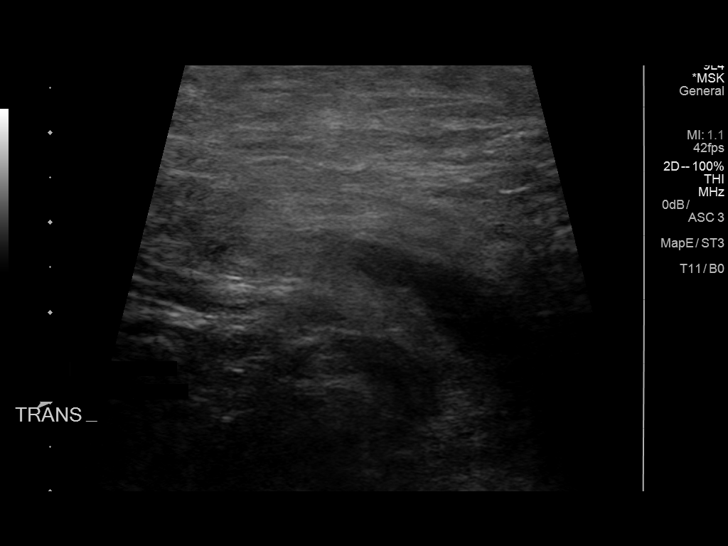
[im 7/14]
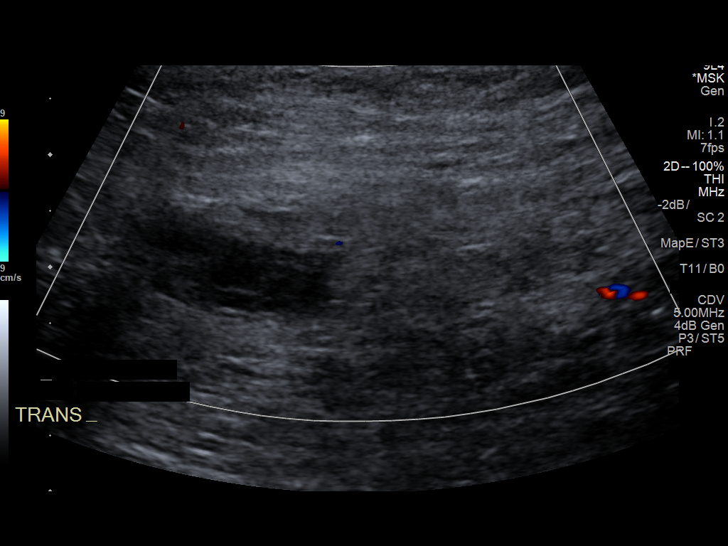
[im 8/14]
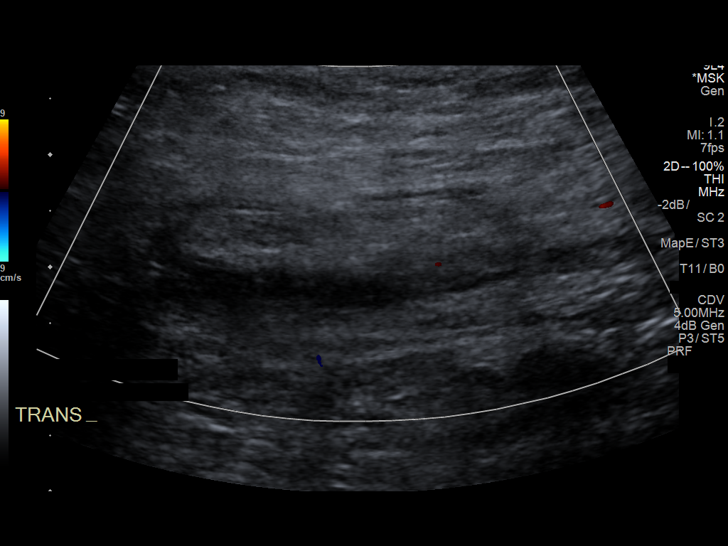
[im 9/14]
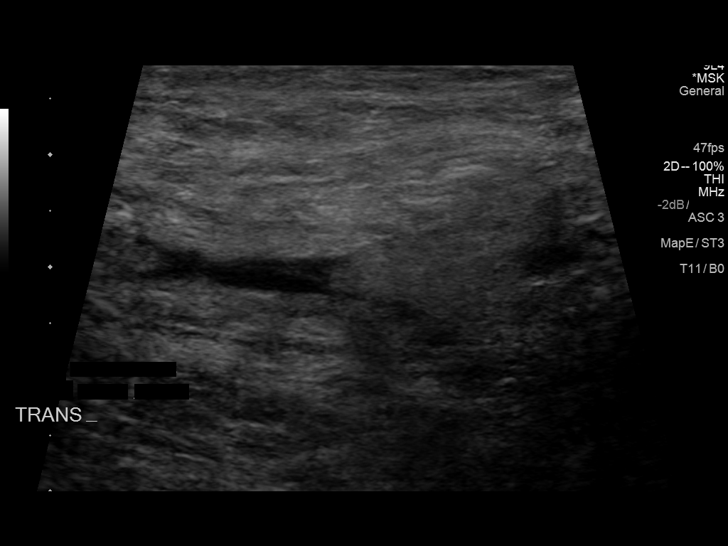
[im 10/14]
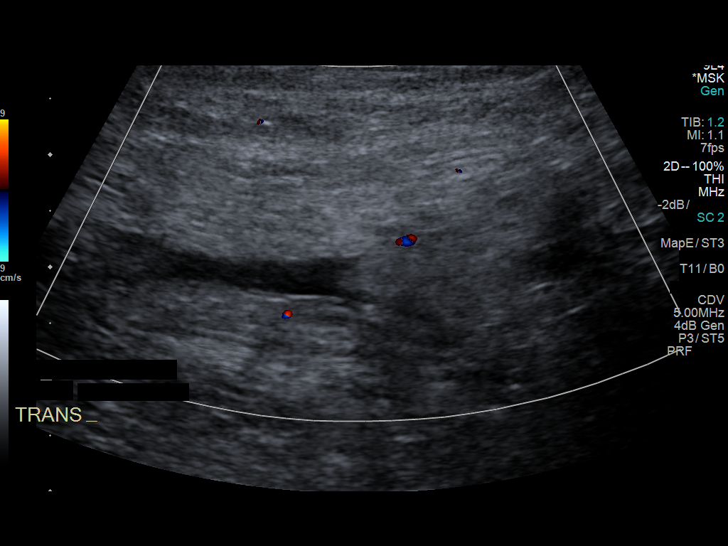
[im 11/14]
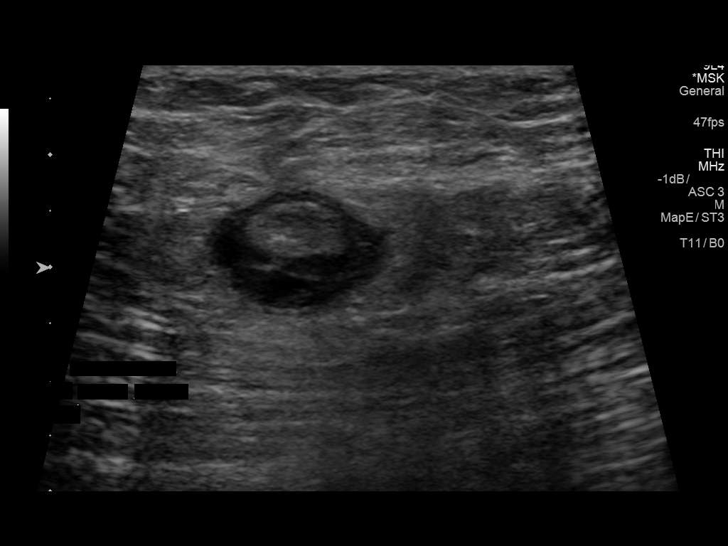
[im 12/14]
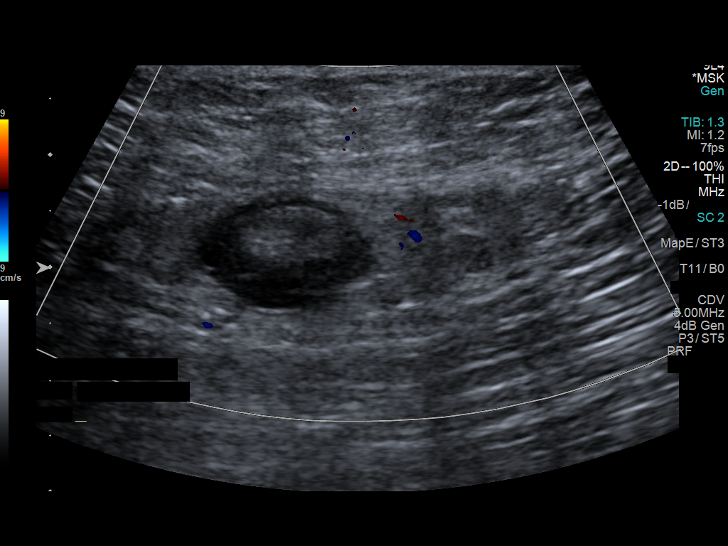
[im 13/14]
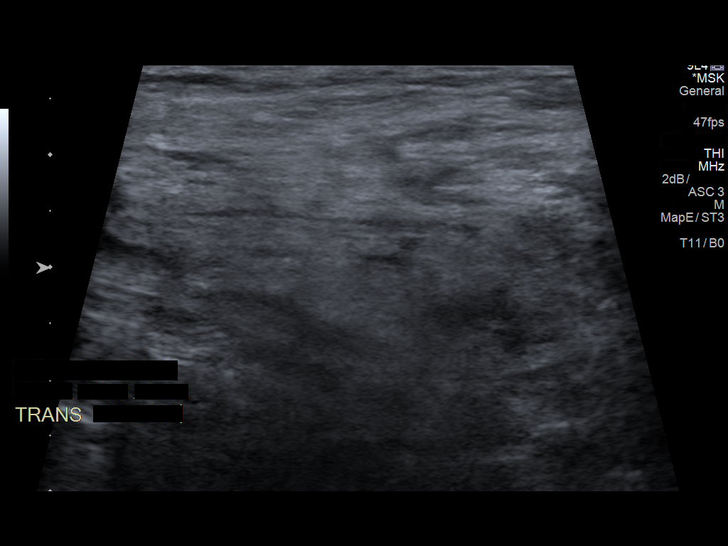
[im 14/14]
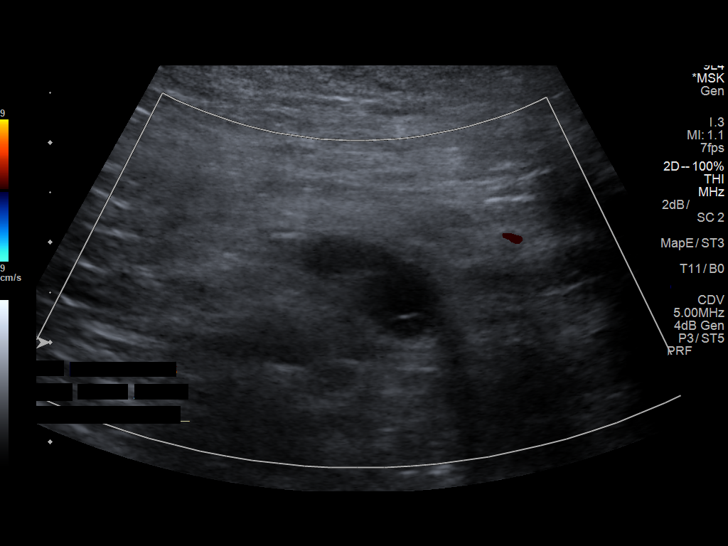

[14 of 14 positions shown; findings below may reference images not displayed]

FINDINGS: Ultrasound over the area in question was performed. Ultrasound does
demonstrate a linear area of diminished echogenicity tracking within
the soft tissues horizontally. In some areas this collection appears
typical of edema, but the larger areas are more fluid than would be
expected with simple edema. The most likely etiology would be that
of prior trauma with resolving hematoma/seroma and adjacent edema.
However the patient does not give a history of trauma. The patient
does say that this area may be becoming smaller. If this area does
not resolve, then MRI would be recommended to assess more
sensitively.
IMPRESSION: There is apparent fluid in the soft tissues of the left buttocks
some of which may represent edema, but there is more fluid in
several areas than expected with simple edema. If there is no
history of trauma in which this could represent resolving
hematoma/seroma, and this area persists, then MRI would be
recommended to assess further.

## 2019-11-22 NOTE — Progress Notes (Signed)
   Covid-19 Vaccination Clinic  Name:  Megan Bowers    MRN: [id number] DOB: 02-Mar-1956  11/22/2019  [name] was observed post Covid-19 immunization for 15 minutes without incidence. She was provided with Vaccine Information Sheet and instruction to access the V-Safe system.   [name] was instructed to call 911 with any severe reactions post vaccine: Marland Kitchen Difficulty breathing  . Swelling of your face and throat  . A fast heartbeat  . A bad rash all over your body  . Dizziness and weakness    Immunizations Administered    Name Date Dose VIS Date Route   Pfizer COVID-19 Vaccine 11/22/2019  1:45 PM 0.3 mL 09/12/2019 Intramuscular   Manufacturer: [place]   Lot: [id number]   [place]: [id number]

## 2019-12-01 ENCOUNTER — Ambulatory Visit: Payer: No Typology Code available for payment source

## 2019-12-16 ENCOUNTER — Ambulatory Visit: Payer: No Typology Code available for payment source | Attending: Internal Medicine

## 2019-12-16 DIAGNOSIS — Z23 Encounter for immunization: Secondary | ICD-10-CM

## 2019-12-16 NOTE — Progress Notes (Signed)
   Covid-19 Vaccination Clinic  Name:  Megan Bowers    MRN: [id number] DOB: [birth year]  12/16/2019  [name] was observed post Covid-19 immunization for 15 minutes without incident. She was provided with Vaccine Information Sheet and instruction to access the V-Safe system.   [name] was instructed to call 911 with any severe reactions post vaccine: Marland Kitchen Difficulty breathing  . Swelling of face and throat  . A fast heartbeat  . A bad rash all over body  . Dizziness and weakness   Immunizations Administered    Name Date Dose VIS Date Route   Pfizer COVID-19 Vaccine 12/16/2019 12:52 PM 0.3 mL 09/12/2019 Intramuscular   Manufacturer: [place]   Lot: [id number]   [place]: [id number]

## 2020-01-27 ENCOUNTER — Ambulatory Visit: Payer: No Typology Code available for payment source

## 2020-02-10 ENCOUNTER — Other Ambulatory Visit: Payer: Self-pay

## 2020-02-10 ENCOUNTER — Ambulatory Visit
Admission: RE | Admit: 2020-02-10 | Discharge: 2020-02-10 | Disposition: A | Discharge status: Home / Self Care | Payer: No Typology Code available for payment source | Source: Ambulatory Visit | Attending: Endocrinology | Admitting: Endocrinology

## 2020-02-10 DIAGNOSIS — Z1231 Encounter for screening mammogram for malignant neoplasm of breast: Secondary | ICD-10-CM

## 2020-07-13 ENCOUNTER — Ambulatory Visit: Payer: No Typology Code available for payment source | Attending: Internal Medicine

## 2020-07-13 DIAGNOSIS — Z23 Encounter for immunization: Secondary | ICD-10-CM

## 2020-07-13 NOTE — Progress Notes (Signed)
   Covid-19 Vaccination Clinic  Name:  Megan Bowers    MRN: [id number] DOB: 08/29/1956  07/13/2020  Megan Bowers was observed post Covid-19 immunization for 15 minutes without incident. She was provided with Vaccine Information Sheet and instruction to access the V-Safe system.   Megan Bowers was instructed to call 911 with any severe reactions post vaccine: Marland Kitchen Difficulty breathing  . Swelling of face and throat  . A fast heartbeat  . A bad rash all over body  . Dizziness and weakness

## 2021-01-07 ENCOUNTER — Ambulatory Visit: Payer: No Typology Code available for payment source | Attending: Internal Medicine

## 2021-01-07 ENCOUNTER — Other Ambulatory Visit: Payer: Self-pay

## 2021-01-07 DIAGNOSIS — Z23 Encounter for immunization: Secondary | ICD-10-CM

## 2021-01-07 NOTE — Progress Notes (Signed)
   Covid-19 Vaccination Clinic  Name:  Chanee Henrickson    MRN: [id number] DOB: Nov 11, 1955  01/07/2021  Ms. Casillas was observed post Covid-19 immunization for 15 minutes without incident. She was provided with Vaccine Information Sheet and instruction to access the V-Safe system.   Ms. Gotwalt was instructed to call 911 with any severe reactions post vaccine: Marland Kitchen Difficulty breathing  . Swelling of face and throat  . A fast heartbeat  . A bad rash all over body  . Dizziness and weakness   Immunizations Administered    Name Date Dose VIS Date Route   PFIZER Comrnaty([place] TOP) Covid-19 Vaccine 01/07/2021 10:30 AM 0.3 mL 09/09/2020 Intramuscular   Manufacturer: [place]   Lot: [id number]   [place]: [phone]

## 2021-01-10 ENCOUNTER — Other Ambulatory Visit: Payer: Self-pay | Admitting: Endocrinology

## 2021-01-10 DIAGNOSIS — Z1231 Encounter for screening mammogram for malignant neoplasm of breast: Secondary | ICD-10-CM

## 2021-01-14 ENCOUNTER — Other Ambulatory Visit (HOSPITAL_BASED_OUTPATIENT_CLINIC_OR_DEPARTMENT_OTHER): Payer: Self-pay

## 2021-01-14 MED ORDER — COVID-19 MRNA VACCINE (PFIZER) 30 MCG/0.3ML IM SUSP
INTRAMUSCULAR | 0 refills | Status: DC
  Filled 2021-01-14: qty 0.3, 1d supply, fill #0

## 2021-03-02 ENCOUNTER — Ambulatory Visit
Admission: RE | Admit: 2021-03-02 | Discharge: 2021-03-02 | Disposition: A | Discharge status: Home / Self Care | Payer: No Typology Code available for payment source | Source: Ambulatory Visit | Attending: Endocrinology | Admitting: Endocrinology

## 2021-03-02 ENCOUNTER — Other Ambulatory Visit: Payer: Self-pay

## 2021-03-02 DIAGNOSIS — Z1231 Encounter for screening mammogram for malignant neoplasm of breast: Secondary | ICD-10-CM

## 2021-07-14 ENCOUNTER — Ambulatory Visit (INDEPENDENT_AMBULATORY_CARE_PROVIDER_SITE_OTHER): Payer: PRIVATE HEALTH INSURANCE | Admitting: Sports Medicine

## 2021-07-14 ENCOUNTER — Other Ambulatory Visit: Payer: Self-pay

## 2021-07-14 ENCOUNTER — Encounter: Payer: Self-pay | Admitting: Sports Medicine

## 2021-07-14 DIAGNOSIS — L909 Atrophic disorder of skin, unspecified: Secondary | ICD-10-CM | POA: Diagnosis not present

## 2021-07-14 DIAGNOSIS — M79671 Pain in right foot: Secondary | ICD-10-CM | POA: Diagnosis not present

## 2021-07-14 DIAGNOSIS — M2040 Other hammer toe(s) (acquired), unspecified foot: Secondary | ICD-10-CM | POA: Diagnosis not present

## 2021-07-14 DIAGNOSIS — L84 Corns and callosities: Secondary | ICD-10-CM

## 2021-07-14 NOTE — Progress Notes (Signed)
Subjective: Megan Bowers is a 65 y.o. female patient who presents to office for evaluation of Right foot pain secondary to callus skin. Patient complains of pain at the lesion present at the ball of the foot.  Patient reports that the callus gets so painful that she has to use callus cushions to help.  Patient reports that her husband used to use a pumice to help tried to both feet down but it still keeps coming back and is painful.  Patient admits to a history of hammertoe correction over 20 years ago states that it worked better on the left foot as compared to the right.  Patient denies any other pedal complaints at this time.  Patient Active Problem List   Diagnosis Date Noted   Benign neoplasm of colon 04/16/2018   Disorder of bone 04/16/2018   Gastro-esophageal reflux disease without esophagitis 04/16/2018   Metatarsal deformity 09/21/2015   Pronation deformity of ankle, acquired 09/21/2015   Achilles tendonitis 05/20/2013   Callus of foot 05/20/2013   Essential hypertension 03/27/2011   Hyperlipidemia 09/07/2009   Hypothyroidism 09/07/2009    Current Outpatient Medications on File Prior to Visit  Medication Sig Dispense Refill   Aspirin (VAZALORE) 81 MG CAPS Two (2) times a week.     aspirin 81 MG tablet Take 81 mg by mouth daily.     atorvastatin (LIPITOR) 40 MG tablet Take 40 mg by mouth daily.     Calcium Carbonate-Vit D-Min (CALTRATE 600+D PLUS MINERALS) 600-800 MG-UNIT CHEW daily.     Calcium Citrate (CITRACAL PO) Take 400 mg by mouth daily.     COVID-19 mRNA vaccine, Pfizer, 30 MCG/0.3ML injection Inject into the muscle. 0.3 mL 0   doxycycline (VIBRA-TABS) 100 MG tablet Take 100 mg by mouth 2 (two) times daily.     ezetimibe (ZETIA) 10 MG tablet Take 1 tablet by mouth daily.     levothyroxine (SYNTHROID) 125 MCG tablet Take by mouth.     levothyroxine (SYNTHROID, LEVOTHROID) 125 MCG tablet Take 125 mcg by mouth daily before breakfast.     losartan (COZAAR) 50 MG  tablet Take 50 mg by mouth daily.     losartan (COZAAR) 50 MG tablet Take 1 tablet by mouth daily.     meloxicam (MOBIC) 15 MG tablet once as needed.     Multiple Vitamin (MULTI-VITAMIN) tablet Take 1 tablet by mouth daily.     omeprazole (PRILOSEC) 20 MG capsule Take 1 capsule (20 mg total) by mouth daily. 30 capsule 11   omeprazole (PRILOSEC) 40 MG capsule Take 1 capsule (40 mg total) by mouth daily. 90 capsule 3   rosuvastatin (CRESTOR) 10 MG tablet Take 10 mg by mouth once a week.     vitamin C (ASCORBIC ACID) 500 MG tablet Take 500 mg by mouth daily.     Vitamin D, Ergocalciferol, (DRISDOL) 1.25 MG (50000 UNIT) CAPS capsule Take by mouth.     Current Facility-Administered Medications on File Prior to Visit  Medication Dose Route Frequency Provider Last Rate Last Admin   0.9 %  sodium chloride infusion  500 mL Intravenous Continuous Irene Shipper, MD        No Known Allergies  Objective:  General: Alert and oriented x3 in no acute distress  Dermatology: Keratotic lesion present plantar second metatarsal head, plantar fourth and fifth metatarsal head on the right skin lines transversing the lesion, pain is present with direct pressure to the lesion with a central nucleated core noted, no webspace  macerations, no ecchymosis bilateral, all nails x 10 are well manicured.  Vascular: Dorsalis Pedis and Posterior Tibial pedal pulses 1/4, Capillary Fill Time 3 seconds, + pedal hair growth bilateral, no edema bilateral lower extremities, Temperature gradient within normal limits.  Neurology: Johney Maine sensation intact via light touch bilateral.  Musculoskeletal: Mild tenderness with palpation at the keratotic lesion sites on the right forefoot.  Worse pain noted at plantar second metatarsal on the right.  There is digital contracture bilateral consistent with residual hammertoe deformity.  Bilateral second toes are the longus.  There is fat pad atrophy noted bilateral with prominent metatarsal heads  worse on the right muscular strength 5/5 in all groups without pain or limitation on range of motion.  Assessment and Plan: Problem List Items Addressed This Visit   None Visit Diagnoses     Callus    -  Primary   Right foot pain       Hammer toe, unspecified laterality       Fat pad atrophy of foot           -Complete examination performed -Discussed treatment options -Parred keratoic lesion using a 15 blade x3 on the right.  Treated the area withSalinocaine covered with Band-Aid -Encouraged daily skin emollients, dispensed sample of foot miracle cream Advised good supportive shoes and to refrain from walking around barefooted also dispensed metatarsal padding for the right to provide additional cushioning to try to help slow the recurrence of the right foot calluses -Patient to return to office as scheduled or sooner if condition worsens.  Advised patient that there is no surgery that is needed at this time and because she had surgery many years ago I would be very hesitant to try to repeat surgery at this time due to her recurrence of hammertoes.  Advised patient surgery is a last resort option.  Landis Martins, DPM

## 2021-09-08 ENCOUNTER — Ambulatory Visit (INDEPENDENT_AMBULATORY_CARE_PROVIDER_SITE_OTHER): Payer: No Typology Code available for payment source | Admitting: Sports Medicine

## 2021-09-08 ENCOUNTER — Encounter: Payer: Self-pay | Admitting: Sports Medicine

## 2021-09-08 ENCOUNTER — Other Ambulatory Visit: Payer: Self-pay

## 2021-09-08 DIAGNOSIS — M2040 Other hammer toe(s) (acquired), unspecified foot: Secondary | ICD-10-CM

## 2021-09-08 DIAGNOSIS — L84 Corns and callosities: Secondary | ICD-10-CM

## 2021-09-08 DIAGNOSIS — M79671 Pain in right foot: Secondary | ICD-10-CM

## 2021-09-08 DIAGNOSIS — L909 Atrophic disorder of skin, unspecified: Secondary | ICD-10-CM

## 2021-09-08 NOTE — Progress Notes (Signed)
Subjective: Megan Bowers is a 65 y.o. female patient who returns to office for callus trim on the right foot.  Patient reports that previous callus trim really helped.  Patient also reports that the metatarsal pad has helped as well and states that she has been consistent with using topical cream to keep the callused areas from building back up so quickly.  Patient denies any other pedal complaints at this time.  Patient Active Problem List   Diagnosis Date Noted   Benign neoplasm of colon 04/16/2018   Disorder of bone 04/16/2018   Gastro-esophageal reflux disease without esophagitis 04/16/2018   Metatarsal deformity 09/21/2015   Pronation deformity of ankle, acquired 09/21/2015   Achilles tendonitis 05/20/2013   Callus of foot 05/20/2013   Essential hypertension 03/27/2011   Hyperlipidemia 09/07/2009   Hypothyroidism 09/07/2009    Current Outpatient Medications on File Prior to Visit  Medication Sig Dispense Refill   Aspirin (VAZALORE) 81 MG CAPS Two (2) times a week.     aspirin 81 MG tablet Take 81 mg by mouth daily.     atorvastatin (LIPITOR) 40 MG tablet Take 40 mg by mouth daily.     Calcium Carbonate-Vit D-Min (CALTRATE 600+D PLUS MINERALS) 600-800 MG-UNIT CHEW daily.     Calcium Citrate (CITRACAL PO) Take 400 mg by mouth daily.     COVID-19 mRNA vaccine, Pfizer, 30 MCG/0.3ML injection Inject into the muscle. 0.3 mL 0   doxycycline (VIBRA-TABS) 100 MG tablet Take 100 mg by mouth 2 (two) times daily.     ezetimibe (ZETIA) 10 MG tablet Take 1 tablet by mouth daily.     levothyroxine (SYNTHROID) 125 MCG tablet Take by mouth.     levothyroxine (SYNTHROID, LEVOTHROID) 125 MCG tablet Take 125 mcg by mouth daily before breakfast.     losartan (COZAAR) 50 MG tablet Take 50 mg by mouth daily.     losartan (COZAAR) 50 MG tablet Take 1 tablet by mouth daily.     meloxicam (MOBIC) 15 MG tablet once as needed.     Multiple Vitamin (MULTI-VITAMIN) tablet Take 1 tablet by mouth  daily.     omeprazole (PRILOSEC) 20 MG capsule Take 1 capsule (20 mg total) by mouth daily. 30 capsule 11   omeprazole (PRILOSEC) 40 MG capsule Take 1 capsule (40 mg total) by mouth daily. 90 capsule 3   rosuvastatin (CRESTOR) 10 MG tablet Take 10 mg by mouth once a week.     vitamin C (ASCORBIC ACID) 500 MG tablet Take 500 mg by mouth daily.     Vitamin D, Ergocalciferol, (DRISDOL) 1.25 MG (50000 UNIT) CAPS capsule Take by mouth.     Current Facility-Administered Medications on File Prior to Visit  Medication Dose Route Frequency Provider Last Rate Last Admin   0.9 %  sodium chloride infusion  500 mL Intravenous Continuous Irene Shipper, MD        No Known Allergies  Objective:  General: Alert and oriented x3 in no acute distress  Dermatology: Keratotic lesion present plantar second metatarsal head, plantar fifth metatarsal head on the right skin lines transversing the lesions, pain is present with direct pressure to the lesion with a central nucleated core noted, no webspace macerations, no ecchymosis bilateral, all nails x 10 are well manicured.  Vascular: Dorsalis Pedis and Posterior Tibial pedal pulses 1/4, Capillary Fill Time 3 seconds, + pedal hair growth bilateral, no edema bilateral lower extremities, Temperature gradient within normal limits.  Neurology: Gross sensation intact via light  touch bilateral.  Musculoskeletal: Mild tenderness with palpation at the keratotic lesion sites on the right forefoot sub met 2>5.  Significant hammertoe deformity with dorsal dislocation of the right second toe and fat pad atrophy with prominent metatarsal heads.  Strength within normal limits.  Assessment and Plan: Problem List Items Addressed This Visit   None Visit Diagnoses     Callus    -  Primary   Fat pad atrophy of foot       Right foot pain       Hammer toe, unspecified laterality           -Complete examination performed -Re-Discussed treatment options for painful callus  secondary to foot mechanics -Parred keratoic lesion using a 15 blade x2 on the right without incident.  Treated the area withSalinocaine covered with Band-Aid and advised patient that she can remove Band-Aid later today -Encouraged daily skin emollients, continue with foot miracle cream -Continue with metatarsal padding -Continue with good supportive shoes -Return to office in 62 days or sooner if problems or issues arise.  Landis Martins, DPM

## 2021-11-03 ENCOUNTER — Ambulatory Visit: Payer: No Typology Code available for payment source | Admitting: Sports Medicine

## 2021-11-24 ENCOUNTER — Ambulatory Visit (INDEPENDENT_AMBULATORY_CARE_PROVIDER_SITE_OTHER): Payer: No Typology Code available for payment source | Admitting: Sports Medicine

## 2021-11-24 ENCOUNTER — Other Ambulatory Visit: Payer: Self-pay

## 2021-11-24 DIAGNOSIS — L909 Atrophic disorder of skin, unspecified: Secondary | ICD-10-CM

## 2021-11-24 DIAGNOSIS — M79671 Pain in right foot: Secondary | ICD-10-CM

## 2021-11-24 DIAGNOSIS — L84 Corns and callosities: Secondary | ICD-10-CM

## 2021-11-24 DIAGNOSIS — M79672 Pain in left foot: Secondary | ICD-10-CM

## 2021-11-24 NOTE — Progress Notes (Signed)
Subjective: Megan Bowers is a 66 y.o. female patient who returns to office for callus trim on the right foot and states that she may be getting a little bit of callusing on the left as well.  States that the padding and the trimming has helped currently has no pain or symptoms since she has been coming for routine trimming patient also has questions about the color of her right first and second toenails.  No other pedal complaints noted.  Patient Active Problem List   Diagnosis Date Noted   Benign neoplasm of colon 04/16/2018   Disorder of bone 04/16/2018   Gastro-esophageal reflux disease without esophagitis 04/16/2018   Metatarsal deformity 09/21/2015   Pronation deformity of ankle, acquired 09/21/2015   Achilles tendonitis 05/20/2013   Callus of foot 05/20/2013   Essential hypertension 03/27/2011   Hyperlipidemia 09/07/2009   Hypothyroidism 09/07/2009    Current Outpatient Medications on File Prior to Visit  Medication Sig Dispense Refill   Aspirin (VAZALORE) 81 MG CAPS Two (2) times a week.     aspirin 81 MG tablet Take 81 mg by mouth daily.     atorvastatin (LIPITOR) 40 MG tablet Take 40 mg by mouth daily.     Calcium Carbonate-Vit D-Min (CALTRATE 600+D PLUS MINERALS) 600-800 MG-UNIT CHEW daily.     Calcium Citrate (CITRACAL PO) Take 400 mg by mouth daily.     COVID-19 mRNA vaccine, Pfizer, 30 MCG/0.3ML injection Inject into the muscle. 0.3 mL 0   doxycycline (VIBRA-TABS) 100 MG tablet Take 100 mg by mouth 2 (two) times daily.     ezetimibe (ZETIA) 10 MG tablet Take 1 tablet by mouth daily.     levothyroxine (SYNTHROID) 125 MCG tablet Take by mouth.     levothyroxine (SYNTHROID, LEVOTHROID) 125 MCG tablet Take 125 mcg by mouth daily before breakfast.     losartan (COZAAR) 50 MG tablet Take 50 mg by mouth daily.     losartan (COZAAR) 50 MG tablet Take 1 tablet by mouth daily.     meloxicam (MOBIC) 15 MG tablet once as needed.     Multiple Vitamin (MULTI-VITAMIN) tablet  Take 1 tablet by mouth daily.     omeprazole (PRILOSEC) 20 MG capsule Take 1 capsule (20 mg total) by mouth daily. 30 capsule 11   omeprazole (PRILOSEC) 40 MG capsule Take 1 capsule (40 mg total) by mouth daily. 90 capsule 3   rosuvastatin (CRESTOR) 10 MG tablet Take 10 mg by mouth once a week.     vitamin C (ASCORBIC ACID) 500 MG tablet Take 500 mg by mouth daily.     Vitamin D, Ergocalciferol, (DRISDOL) 1.25 MG (50000 UNIT) CAPS capsule Take by mouth.     Current Facility-Administered Medications on File Prior to Visit  Medication Dose Route Frequency Provider Last Rate Last Admin   0.9 %  sodium chloride infusion  500 mL Intravenous Continuous Irene Shipper, MD        No Known Allergies  Objective:  General: Alert and oriented x3 in no acute distress  Dermatology: Keratotic lesion present plantar second metatarsal head, plantar fifth metatarsal head on the right > left with skin lines transversing the lesions, pain is present with direct pressure to the lesion with a central nucleated core noted, no webspace macerations, no ecchymosis bilateral, all nails x 10 are well manicured.  Normal pigmentation noted to the right first and second toenails bilateral consistent with changes that are benign.  Vascular: Dorsalis Pedis and Posterior Tibial pedal  pulses 1/4, Capillary Fill Time 3 seconds, + pedal hair growth bilateral, no edema bilateral lower extremities, Temperature gradient within normal limits.  Neurology: Johney Maine sensation intact via light touch bilateral.  Musculoskeletal: Mild tenderness with palpation at the keratotic lesion sites on the right forefoot sub met 2>5 and no significant pain left submet 5.  Significant hammertoe deformity with dorsal dislocation of the right second toe and fat pad atrophy with prominent metatarsal heads.  Strength within normal limits.  Assessment and Plan: Problem List Items Addressed This Visit   None Visit Diagnoses     Callus    -  Primary    Fat pad atrophy of foot       Right foot pain       Left foot pain           -Complete examination performed -Advised patient that the changes to her toenails on the right and left foot are normal pigment discolorations but closely monitor if pigment darkens or if there are lines or streaks to come in for reevaluation -Re-Discussed treatment options for painful callus secondary to foot mechanics -Parred keratoic lesion using a 15 blade x2 on the right without incident.  Treated the area withSalinocaine covered with Band-Aid and advised patient that she can remove Band-Aid later today like before -Encouraged daily skin emollients, continue with foot miracle cream as before -Continue with good supportive shoes like previous and metatarsal padding since it is helping I also advised patient long-term may benefit from over-the-counter orthotics; shoe and orthotic list provided to patient -Return to office in 62 days or sooner if problems or issues arise.  Landis Martins, DPM

## 2021-11-24 NOTE — Patient Instructions (Signed)
Shoe List For tennis shoes recommend:  Brea New balance [place] HOKA Can be purchased at [employer] sports or [employer] arch fit Can be purchased at any major retailers  Vionic  SAS Can be purchased at [employer] or [employer]   For work shoes recommend: [employer] Work [employer] Can be purchased at a variety of places or [employer]   For casual shoes recommend: Oofos Can be purchased at [employer] sports or [employer]  Can be purchased at [employer] or [place] recommend: Power Steps Can be purchased in office/[facility] [employer] Can be purchased at [employer] sports or [employer] Can be purchased at [place] recommend: Leisure centre manager at [employer] and [employer]

## 2022-01-23 ENCOUNTER — Other Ambulatory Visit: Payer: Self-pay | Admitting: Endocrinology

## 2022-01-23 DIAGNOSIS — Z1231 Encounter for screening mammogram for malignant neoplasm of breast: Secondary | ICD-10-CM

## 2022-01-26 ENCOUNTER — Ambulatory Visit (INDEPENDENT_AMBULATORY_CARE_PROVIDER_SITE_OTHER): Payer: No Typology Code available for payment source | Admitting: Sports Medicine

## 2022-01-26 ENCOUNTER — Encounter: Payer: Self-pay | Admitting: Sports Medicine

## 2022-01-26 DIAGNOSIS — M79671 Pain in right foot: Secondary | ICD-10-CM

## 2022-01-26 DIAGNOSIS — L84 Corns and callosities: Secondary | ICD-10-CM | POA: Diagnosis not present

## 2022-01-26 DIAGNOSIS — L909 Atrophic disorder of skin, unspecified: Secondary | ICD-10-CM

## 2022-01-26 NOTE — Progress Notes (Signed)
Subjective: ?[name] is a 66 y.o. female patient who returns to office for callus trim on the right foot and states that she may be getting a little bit of callusing on the left as well.  States that callus trims have helped. Denies any other pedal complaints at this time. ? ? ?Patient Active Problem List  ? Diagnosis Date Noted  ? Benign neoplasm of colon 04/16/2018  ? Disorder of bone 04/16/2018  ? Gastro-esophageal reflux disease without esophagitis 04/16/2018  ? Metatarsal deformity 09/21/2015  ? Pronation deformity of ankle, acquired 09/21/2015  ? Achilles tendonitis 05/20/2013  ? Callus of foot 05/20/2013  ? Essential hypertension 03/27/2011  ? Hyperlipidemia 09/07/2009  ? Hypothyroidism 09/07/2009  ? ? ?Current Outpatient Medications on File Prior to Visit  ?Medication Sig Dispense Refill  ? Aspirin (VAZALORE) 81 MG CAPS Two (2) times a week.    ? aspirin 81 MG tablet Take 81 mg by mouth daily.    ? atorvastatin (LIPITOR) 40 MG tablet Take 40 mg by mouth daily.    ? Calcium Carbonate-Vit D-Min (CALTRATE 600+D PLUS MINERALS) 600-800 MG-UNIT CHEW daily.    ? Calcium Citrate (CITRACAL PO) Take 400 mg by mouth daily.    ? COVID-19 mRNA vaccine, Pfizer, 30 MCG/0.3ML injection Inject into the muscle. 0.3 mL 0  ? doxycycline (VIBRA-TABS) 100 MG tablet Take 100 mg by mouth 2 (two) times daily.    ? ezetimibe (ZETIA) 10 MG tablet Take 1 tablet by mouth daily.    ? levothyroxine (SYNTHROID) 125 MCG tablet Take by mouth.    ? levothyroxine (SYNTHROID, LEVOTHROID) 125 MCG tablet Take 125 mcg by mouth daily before breakfast.    ? losartan (COZAAR) 50 MG tablet Take 50 mg by mouth daily.    ? losartan (COZAAR) 50 MG tablet Take 1 tablet by mouth daily.    ? meloxicam (MOBIC) 15 MG tablet once as needed.    ? Multiple Vitamin (MULTI-VITAMIN) tablet Take 1 tablet by mouth daily.    ? omeprazole (PRILOSEC) 20 MG capsule Take 1 capsule (20 mg total) by mouth daily. 30 capsule 11  ? omeprazole (PRILOSEC) 40 MG  capsule Take 1 capsule (40 mg total) by mouth daily. 90 capsule 3  ? rosuvastatin (CRESTOR) 10 MG tablet Take 10 mg by mouth once a week.    ? vitamin C (ASCORBIC ACID) 500 MG tablet Take 500 mg by mouth daily.    ? Vitamin D, Ergocalciferol, (DRISDOL) 1.25 MG (50000 UNIT) CAPS capsule Take by mouth.    ? ?Current Facility-Administered Medications on File Prior to Visit  ?Medication Dose Route Frequency Provider Last Rate Last Admin  ? 0.9 %  sodium chloride infusion  500 mL Intravenous Continuous Irene Shipper, MD      ? ? ?No Known Allergies ? ?Objective:  ?General: Alert and oriented x3 in no acute distress ? ?Dermatology: Keratotic lesion present plantar second metatarsal head, plantar fifth metatarsal head on the right > left with skin lines transversing the lesions, pain is present with direct pressure to the lesion with a central nucleated core noted, no webspace macerations, no ecchymosis bilateral, all nails x 10 are well manicured.  Normal pigmentation noted to the right first and second toenails bilateral consistent with changes that are benign. ? ?Vascular: Dorsalis Pedis and Posterior Tibial pedal pulses 1/4, Capillary Fill Time 3 seconds, + pedal hair growth bilateral, no edema bilateral lower extremities, Temperature gradient within normal limits. ? ?Neurology: Gross sensation intact via  light touch bilateral. ? ?Musculoskeletal: Mild tenderness with palpation at the keratotic lesion sites on the right forefoot sub met 5>2 and no significant pain left submet 5.  Significant hammertoe deformity with dorsal dislocation of the right second toe and fat pad atrophy with prominent metatarsal heads.  Strength within normal limits. ? ?Assessment and Plan: ?Problem List Items Addressed This Visit   ?None ?Visit Diagnoses   ? ? Callus    -  Primary  ? Fat pad atrophy of foot      ? Right foot pain      ? ?  ? ? ?-Complete examination performed ?-Re-Discussed treatment options for painful callus secondary to  foot mechanics ?-Parred keratoic lesion using a 15 blade x2 on the right without incident.  Treated the area withSalinocaine covered with Band-Aid and advised patient that she can remove Band-Aid later today like before ?-Encouraged daily skin emollients, like previous ?-Return to office in 9-10 weeks for callus care or sooner if problems or issues arise. ? ?Landis Martins, DPM ? ?

## 2022-03-03 ENCOUNTER — Ambulatory Visit: Payer: No Typology Code available for payment source

## 2022-03-08 ENCOUNTER — Ambulatory Visit
Admission: RE | Admit: 2022-03-08 | Discharge: 2022-03-08 | Disposition: A | Discharge status: Home / Self Care | Payer: Medicare Other | Source: Ambulatory Visit | Attending: Endocrinology | Admitting: Endocrinology

## 2022-03-08 DIAGNOSIS — Z1231 Encounter for screening mammogram for malignant neoplasm of breast: Secondary | ICD-10-CM

## 2022-04-12 ENCOUNTER — Ambulatory Visit (INDEPENDENT_AMBULATORY_CARE_PROVIDER_SITE_OTHER): Payer: Medicare Other | Admitting: Podiatry

## 2022-04-12 ENCOUNTER — Encounter: Payer: Self-pay | Admitting: Podiatry

## 2022-04-12 DIAGNOSIS — L84 Corns and callosities: Secondary | ICD-10-CM | POA: Diagnosis not present

## 2022-04-12 DIAGNOSIS — L909 Atrophic disorder of skin, unspecified: Secondary | ICD-10-CM

## 2022-04-12 NOTE — Progress Notes (Signed)
Subjective: Megan Bowers is a 66 y.o. female patient who returns to office for callus trim on the right foot and states that she may be getting a little bit of callusing on the left as well.  States that callus trims have helped. Denies any other pedal complaints at this time.   Patient Active Problem List   Diagnosis Date Noted   Benign neoplasm of colon 04/16/2018   Disorder of bone 04/16/2018   Gastro-esophageal reflux disease without esophagitis 04/16/2018   Metatarsal deformity 09/21/2015   Pronation deformity of ankle, acquired 09/21/2015   Achilles tendonitis 05/20/2013   Callus of foot 05/20/2013   Essential hypertension 03/27/2011   Hyperlipidemia 09/07/2009   Hypothyroidism 09/07/2009    Current Outpatient Medications on File Prior to Visit  Medication Sig Dispense Refill   Aspirin (VAZALORE) 81 MG CAPS Two (2) times a week.     aspirin 81 MG tablet Take 81 mg by mouth daily.     atorvastatin (LIPITOR) 40 MG tablet Take 40 mg by mouth daily.     Calcium Carbonate-Vit D-Min (CALTRATE 600+D PLUS MINERALS) 600-800 MG-UNIT CHEW daily.     Calcium Citrate (CITRACAL PO) Take 400 mg by mouth daily.     COVID-19 mRNA vaccine, Pfizer, 30 MCG/0.3ML injection Inject into the muscle. 0.3 mL 0   doxycycline (VIBRA-TABS) 100 MG tablet Take 100 mg by mouth 2 (two) times daily.     ezetimibe (ZETIA) 10 MG tablet Take 1 tablet by mouth daily.     levothyroxine (SYNTHROID) 125 MCG tablet Take by mouth.     levothyroxine (SYNTHROID, LEVOTHROID) 125 MCG tablet Take 125 mcg by mouth daily before breakfast.     losartan (COZAAR) 50 MG tablet Take 50 mg by mouth daily.     losartan (COZAAR) 50 MG tablet Take 1 tablet by mouth daily.     meloxicam (MOBIC) 15 MG tablet once as needed.     Multiple Vitamin (MULTI-VITAMIN) tablet Take 1 tablet by mouth daily.     omeprazole (PRILOSEC) 20 MG capsule Take 1 capsule (20 mg total) by mouth daily. 30 capsule 11   omeprazole (PRILOSEC) 40 MG  capsule Take 1 capsule (40 mg total) by mouth daily. 90 capsule 3   rosuvastatin (CRESTOR) 10 MG tablet Take 10 mg by mouth once a week.     vitamin C (ASCORBIC ACID) 500 MG tablet Take 500 mg by mouth daily.     Vitamin D, Ergocalciferol, (DRISDOL) 1.25 MG (50000 UNIT) CAPS capsule Take by mouth.     Current Facility-Administered Medications on File Prior to Visit  Medication Dose Route Frequency Provider Last Rate Last Admin   0.9 %  sodium chloride infusion  500 mL Intravenous Continuous Irene Shipper, MD        No Known Allergies  Objective:  General: Alert and oriented x3 in no acute distress  Dermatology: Keratotic lesion present plantar second metatarsal head, plantar fifth metatarsal head on the right > left with skin lines transversing the lesions, pain is present with direct pressure to the lesion with a central nucleated core noted, no webspace macerations, no ecchymosis bilateral, all nails x 10 are well manicured.  Normal pigmentation noted to the right first and second toenails bilateral consistent with changes that are benign.  Vascular: Dorsalis Pedis and Posterior Tibial pedal pulses 1/4, Capillary Fill Time 3 seconds, + pedal hair growth bilateral, no edema bilateral lower extremities, Temperature gradient within normal limits.  Neurology: Gross sensation intact via  light touch bilateral.  Musculoskeletal: Mild tenderness with palpation at the keratotic lesion sites on the right forefoot sub met 5>2 and no significant pain left submet 5.  Significant hammertoe deformity with dorsal dislocation of the right second toe and fat pad atrophy with prominent metatarsal heads.  Strength within normal limits.  Assessment and Plan: Problem List Items Addressed This Visit   None Visit Diagnoses     Callus    -  Primary   Fat pad atrophy of foot           -Complete examination performed -Re-Discussed treatment options for painful callus secondary to foot mechanics -Parred  keratoic lesion using a 15 blade x2 on the right without incident.  Treated the area withSalinocaine covered with Band-Aid and advised patient that she can remove Band-Aid later today like before -Encouraged daily skin emollients, like previous -Return to office in 9-10 weeks for callus care or sooner if problems or issues arise.  Lorenda Peck, DPM

## 2022-07-12 ENCOUNTER — Ambulatory Visit: Payer: Medicare Other | Admitting: Podiatry

## 2022-07-13 ENCOUNTER — Ambulatory Visit (INDEPENDENT_AMBULATORY_CARE_PROVIDER_SITE_OTHER): Payer: Medicare Other | Admitting: Podiatry

## 2022-07-13 DIAGNOSIS — L84 Corns and callosities: Secondary | ICD-10-CM | POA: Diagnosis not present

## 2022-07-13 DIAGNOSIS — L909 Atrophic disorder of skin, unspecified: Secondary | ICD-10-CM

## 2022-07-13 DIAGNOSIS — M79671 Pain in right foot: Secondary | ICD-10-CM

## 2022-07-13 NOTE — Progress Notes (Signed)
  Subjective:  Patient ID: Megan Bowers, female    DOB: Nov 18, 1955,  MRN: [id number]  Chief Complaint  Patient presents with   Callouses    Room 4  Right foot     66 y.o. female presents with the above complaint. History confirmed with patient. Patient presenting with pain related to callus on plantar right forefoot. Patient does not have a history of T2DM with peripheral neuropathy. Patient does/does not have callus present located at the plantar right forefoot at sub 2nd 4th and 5th met heads causing pain.   Objective:  Physical Exam: warm, good capillary refill, DP and PT pulses 2/4 bilateral nail exam normal nails without lesions DP pulses palpable, PT pulses palpable, and protective sensation intact Left Foot:  nontender to palpation, no hyperkeratotic lesions Right Foot:  Hyperkeratotic lesion present present sub 2nd 4th and 5th met heads, painful with direct palpation. Fat pad atrophy.  Assessment:   1. Callus   2. Fat pad atrophy of foot   3. Right foot pain      Plan:  Patient was evaluated and treated and all questions answered.  #Hyperkeratotic lesions/pre ulcerative calluses present sub 2nd 4th and 5th met heads right forefoot All symptomatic hyperkeratoses x 3 separate lesions were safely debrided with a sterile #10 blade to patient's level of comfort without incident. We discussed preventative and palliative care of these lesions including supportive and accommodative shoegear, padding, prefabricated and custom molded accommodative orthoses, use of a pumice stone and lotions/creams daily.   Return in about 9 weeks (around 09/14/2022) for Callus x3 right foot.         Everitt Amber, DPM [facility]

## 2022-09-21 ENCOUNTER — Ambulatory Visit: Payer: Medicare Other | Admitting: Podiatry

## 2022-10-12 ENCOUNTER — Encounter: Payer: Self-pay | Admitting: Podiatry

## 2022-10-12 ENCOUNTER — Ambulatory Visit (INDEPENDENT_AMBULATORY_CARE_PROVIDER_SITE_OTHER): Payer: Medicare Other | Admitting: Podiatry

## 2022-10-12 VITALS — BP 122/62 | HR 78

## 2022-10-12 DIAGNOSIS — L84 Corns and callosities: Secondary | ICD-10-CM

## 2022-10-12 DIAGNOSIS — L909 Atrophic disorder of skin, unspecified: Secondary | ICD-10-CM

## 2022-10-12 NOTE — Progress Notes (Signed)
  Subjective:  Patient ID: Megan Bowers, female    DOB: 02-Oct-1956,  MRN: [id number]  Chief Complaint  Patient presents with   Callouses    Patient is here for right foot callous.    66 y.o. female presents with the above complaint. History confirmed with patient. Patient presenting with pain related to callus on plantar right forefoot. Patient does not have a history of T2DM with peripheral neuropathy. Patient does have callus present located at the plantar right forefoot at sub 2nd 4th and 5th met heads causing pain.   Objective:  Physical Exam: warm, good capillary refill, DP and PT pulses 2/4 bilateral nail exam normal nails without lesions DP pulses palpable, PT pulses palpable, and protective sensation intact Left Foot:  nontender to palpation, no hyperkeratotic lesions Right Foot:  Hyperkeratotic lesion present present sub 2nd 4th and 5th met heads, painful with direct palpation. Fat pad atrophy.  Assessment:   1. Callus   2. Fat pad atrophy of foot       Plan:  Patient was evaluated and treated and all questions answered.  #Hyperkeratotic lesions/pre ulcerative calluses present sub 2nd 4th and 5th met heads right forefoot All symptomatic hyperkeratoses x 3 separate lesions were safely debrided with a sterile #10 blade to patient's level of comfort without incident. We discussed preventative and palliative care of these lesions including supportive and accommodative shoegear, padding, prefabricated and custom molded accommodative orthoses, use of a pumice stone and lotions/creams daily.   Return in about 3 months (around 01/11/2023) for RFC - callluses.         Everitt Amber, DPM [facility]

## 2022-12-13 ENCOUNTER — Encounter: Payer: Self-pay | Admitting: Internal Medicine

## 2023-01-11 ENCOUNTER — Ambulatory Visit (INDEPENDENT_AMBULATORY_CARE_PROVIDER_SITE_OTHER): Payer: Medicare Other | Admitting: Podiatry

## 2023-01-11 DIAGNOSIS — L909 Atrophic disorder of skin, unspecified: Secondary | ICD-10-CM

## 2023-01-11 DIAGNOSIS — L84 Corns and callosities: Secondary | ICD-10-CM

## 2023-01-11 NOTE — Progress Notes (Signed)
  Subjective:  Patient ID: Megan Bowers, female    DOB: 1956-08-13,  MRN: [id number]  Chief Complaint  Patient presents with   routine foot care     67 y.o. female presents with the above complaint. History confirmed with patient. Patient presenting with pain related to callus on plantar right forefoot. Patient does not have a history of T2DM with peripheral neuropathy. Patient does have callus present located at the plantar right forefoot at sub 2nd 4th and 5th met heads causing pain.   Objective:  Physical Exam: warm, good capillary refill, DP and PT pulses 2/4 bilateral nail exam normal nails without lesions DP pulses palpable, PT pulses palpable, and protective sensation intact Left Foot:  nontender to palpation, no hyperkeratotic lesions Right Foot:  Hyperkeratotic lesion present present sub 2nd 4th and 5th met heads, painful with direct palpation. Fat pad atrophy.  Assessment:   1. Callus   2. Fat pad atrophy of foot        Plan:  Patient was evaluated and treated and all questions answered.  #Hyperkeratotic lesions/pre ulcerative calluses present sub 2nd 4th and 5th met heads right forefoot All symptomatic hyperkeratoses x 3 separate lesions were safely debrided with a sterile #10 blade to patient's level of comfort without incident. We discussed preventative and palliative care of these lesions including supportive and accommodative shoegear, padding, prefabricated and custom molded accommodative orthoses, use of a pumice stone and lotions/creams daily.   Return in about 3 months (around 04/12/2023) for RFC.         Corinna Gab, DPM [facility]

## 2023-01-22 ENCOUNTER — Ambulatory Visit (AMBULATORY_SURGERY_CENTER): Payer: Medicare Other | Admitting: *Deleted

## 2023-01-22 VITALS — Ht 66.0 in | Wt 160.0 lb

## 2023-01-22 DIAGNOSIS — Z1211 Encounter for screening for malignant neoplasm of colon: Secondary | ICD-10-CM

## 2023-01-22 MED ORDER — NA SULFATE-K SULFATE-MG SULF 17.5-3.13-1.6 GM/177ML PO SOLN: 1.0000 | Freq: Once | ORAL | 0 refills | Status: AC

## 2023-01-22 NOTE — Progress Notes (Signed)

## 2023-02-12 ENCOUNTER — Other Ambulatory Visit: Payer: Self-pay | Admitting: Endocrinology

## 2023-02-12 DIAGNOSIS — Z1231 Encounter for screening mammogram for malignant neoplasm of breast: Secondary | ICD-10-CM

## 2023-02-14 ENCOUNTER — Encounter: Payer: Self-pay | Admitting: Internal Medicine

## 2023-02-14 ENCOUNTER — Ambulatory Visit: Payer: Medicare Other | Admitting: Internal Medicine

## 2023-02-14 VITALS — BP 127/72 | HR 61 | Temp 98.6°F | Resp 10 | Ht 66.5 in | Wt 160.0 lb

## 2023-02-14 DIAGNOSIS — D120 Benign neoplasm of cecum: Secondary | ICD-10-CM

## 2023-02-14 DIAGNOSIS — Z09 Encounter for follow-up examination after completed treatment for conditions other than malignant neoplasm: Secondary | ICD-10-CM

## 2023-02-14 DIAGNOSIS — K635 Polyp of colon: Secondary | ICD-10-CM

## 2023-02-14 DIAGNOSIS — Z1211 Encounter for screening for malignant neoplasm of colon: Secondary | ICD-10-CM

## 2023-02-14 DIAGNOSIS — Z86010 Personal history of colonic polyps: Secondary | ICD-10-CM

## 2023-02-14 MED ORDER — SODIUM CHLORIDE 0.9 % IV SOLN: 500.0000 mL | Freq: Once | INTRAVENOUS | Status: DC

## 2023-02-14 NOTE — Progress Notes (Signed)
Pt's states no medical or surgical changes since previsit or office visit. 

## 2023-02-14 NOTE — Op Note (Signed)
[facility] Patient Name: Megan Bowers Procedure Date: 02/14/2023 8:01 AM MRN: [id number] Endoscopist: Wilhemina Bonito. Marina Goodell , MD, [id number] Age: 67 Referring MD:  Date of Birth: [birth year] Gender: Female Account #: [id number] Procedure:                Colonoscopy with cold snare polypectomy x 2 Indications:              High risk colon cancer surveillance: Personal                            history of non-advanced adenoma. Previous                            examinations 2009, 2014 Medicines:                Monitored Anesthesia Care Procedure:                Pre-Anesthesia Assessment:                           - Prior to the procedure, a History and Physical                            was performed, and patient medications and                            allergies were reviewed. The patient's tolerance of                            previous anesthesia was also reviewed. The risks                            and benefits of the procedure and the sedation                            options and risks were discussed with the patient.                            All questions were answered, and informed consent                            was obtained. Prior Anticoagulants: The patient has                            taken no anticoagulant or antiplatelet agents.                            After reviewing the risks and benefits, the patient                            was deemed in satisfactory condition to undergo the                            procedure.  After obtaining informed consent, the colonoscope                            was passed under direct vision. Throughout the                            procedure, the patient's blood pressure, pulse, and                            oxygen saturations were monitored continuously. The                            Olympus CF-HQ190L [id number]) Colonoscope was                            introduced through the anus  and advanced to the the                            cecum, identified by appendiceal orifice and                            ileocecal valve. The ileocecal valve, appendiceal                            orifice, and rectum were photographed. The quality                            of the bowel preparation was good. The colonoscopy                            was performed without difficulty. The patient                            tolerated the procedure well. The bowel preparation                            used was MiraLAX followed by SUPREP via split dose                            instruction. Scope In: 8:24:15 AM Scope Out: 8:39:47 AM Scope Withdrawal Time: 0 hours 12 minutes 20 seconds  Total Procedure Duration: 0 hours 15 minutes 32 seconds  Findings:                 Two polyps were found in the cecum. The polyps were                            1 to 2 mm in size. These polyps were removed with a                            cold snare. Resection and retrieval were complete.                           The exam was otherwise  without abnormality on                            direct and retroflexion views. Complications:            No immediate complications. Estimated blood loss:                            None. Estimated Blood Loss:     Estimated blood loss: none. Impression:               - Two 1 to 2 mm polyps in the cecum, removed with a                            cold snare. Resected and retrieved.                           - The examination was otherwise normal on direct                            and retroflexion views. Recommendation:           - Repeat colonoscopy in 7-10 years for surveillance.                           - Patient has a contact number available for                            emergencies. The signs and symptoms of potential                            delayed complications were discussed with the                            patient. Return to normal activities tomorrow.                             Written discharge instructions were provided to the                            patient.                           - Resume previous diet.                           - Continue present medications.                           - Await pathology results. Wilhemina Bonito. Marina Goodell, MD 02/14/2023 8:56:32 AM This report has been signed electronically.

## 2023-02-14 NOTE — Patient Instructions (Addendum)
2 polyps removed and sent to pathology.                                                     - Repeat colonoscopy in 7-10 years for surveillance.                           - Resume previous diet.                           - Continue present medications.                           - Await pathology results.  YOU HAD AN ENDOSCOPIC PROCEDURE TODAY AT THE [facility]:   Refer to the procedure report that was given to you for any specific questions about what was found during the examination.  If the procedure report does not answer your questions, please call your gastroenterologist to clarify.  If you requested that your care partner not be given the details of your procedure findings, then the procedure report has been included in a sealed envelope for you to review at your convenience later.  YOU SHOULD EXPECT: Some feelings of bloating in the abdomen. Passage of more gas than usual.  Walking can help get rid of the air that was put into your GI tract during the procedure and reduce the bloating. If you had a lower endoscopy (such as a colonoscopy or flexible sigmoidoscopy) you may notice spotting of blood in your stool or on the toilet paper. If you underwent a bowel prep for your procedure, you may not have a normal bowel movement for a few days.  Please Note:  You might notice some irritation and congestion in your nose or some drainage.  This is from the oxygen used during your procedure.  There is no need for concern and it should clear up in a day or so.  SYMPTOMS TO REPORT IMMEDIATELY:  Following lower endoscopy (colonoscopy or flexible sigmoidoscopy):  Excessive amounts of blood in the stool  Significant tenderness or worsening of abdominal pains  Swelling of the abdomen that is new, acute  Fever of 100F or higher   For urgent or emergent issues, a gastroenterologist can be reached at any hour by calling [phone]. Do not use MyChart messaging for urgent concerns.     DIET:  We do recommend a small meal at first, but then you may proceed to your regular diet.  Drink plenty of fluids but you should avoid alcoholic beverages for 24 hours.  ACTIVITY:  You should plan to take it easy for the rest of today and you should NOT DRIVE or use heavy machinery until tomorrow (because of the sedation medicines used during the test).    FOLLOW UP: Our staff will call the number listed on your records the next business day following your procedure.  We will call around 7:15- 8:00 am to check on you and address any questions or concerns that you may have regarding the information given to you following your procedure. If we do not reach you, we will leave a message.     If any biopsies were taken you will be contacted by phone or by  letter within the next 1-3 weeks.  Please call us at [phone] if you have not heard about the biopsies in 3 weeks.    SIGNATURES/CONFIDENTIALITY: You and/or your care partner have signed paperwork which will be entered into your electronic medical record.  These signatures attest to the fact that that the information above on your After Visit Summary has been reviewed and is understood.  Full responsibility of the confidentiality of this discharge information lies with you and/or your care-partner.

## 2023-02-14 NOTE — Progress Notes (Signed)
Called to room to assist during endoscopic procedure.  Patient ID and intended procedure confirmed with present staff. Received instructions for my participation in the procedure from the performing physician.  

## 2023-02-14 NOTE — Progress Notes (Signed)
HISTORY OF PRESENT ILLNESS:  Megan Bowers is a 67 y.o. female with a history of nonadvanced adenoma 2009.  Negative colonoscopy 2014.  Now for follow-up surveillance.  No complaints.  REVIEW OF SYSTEMS:  All non-GI ROS negative except for  Past Medical History:  Diagnosis Date   Acanthosis nigricans    Colon polyps    Hyperlipidemia    Hypertension    Thyroid disease    hypothryroid    Past Surgical History:  Procedure Laterality Date   COLONOSCOPY     FOOT SURGERY     TUBAL LIGATION      Social History Megan Bowers  reports that she has never smoked. She has never used smokeless tobacco. She reports that she does not drink alcohol and does not use drugs.  family history is not on file.  No Known Allergies     PHYSICAL EXAMINATION: Vital signs: BP 125/75   Pulse 64   Temp 98.6 F (37 C) (Skin)   Resp 10   Ht 5' 6.5" (1.689 m)   Wt 160 lb (72.6 kg)   SpO2 99%   BMI 25.44 kg/m  General: Well-developed, well-nourished, no acute distress HEENT: Sclerae are anicteric, conjunctiva pink. Oral mucosa intact Lungs: Clear Heart: Regular Abdomen: soft, nontender, nondistended, no obvious ascites, no peritoneal signs, normal bowel sounds. No organomegaly. Extremities: No edema Psychiatric: alert and oriented x3. Cooperative     ASSESSMENT:  History of adenomatous colon polyp   PLAN:   Surveillance colonoscopy

## 2023-02-14 NOTE — Progress Notes (Signed)
Vss nad trans to pacu 

## 2023-02-15 ENCOUNTER — Telehealth: Payer: Self-pay

## 2023-02-15 NOTE — Telephone Encounter (Signed)
  Follow up Call-     02/14/2023    7:22 AM  Call back number  Post procedure Call Back phone  # [phone]  Permission to leave phone message Yes     Patient questions:  Do you have a fever, pain , or abdominal swelling? No. Pain Score  0 *  Have you tolerated food without any problems? Yes.    Have you been able to return to your normal activities? Yes.    Do you have any questions about your discharge instructions: Diet   No. Medications  No. Follow up visit  No.  Do you have questions or concerns about your Care? No.  Actions: * If pain score is 4 or above: No action needed, pain <4.

## 2023-02-16 ENCOUNTER — Encounter: Payer: Self-pay | Admitting: Internal Medicine

## 2023-02-20 ENCOUNTER — Telehealth: Payer: Self-pay | Admitting: Internal Medicine

## 2023-02-20 NOTE — Telephone Encounter (Signed)
Inbound call from patient, following up on pathology results. Would like them explained to her.

## 2023-02-20 NOTE — Telephone Encounter (Signed)
Path letter reviewed with pt over the phone and she is aware of results.

## 2023-03-15 ENCOUNTER — Ambulatory Visit
Admission: RE | Admit: 2023-03-15 | Discharge: 2023-03-15 | Disposition: A | Discharge status: Home / Self Care | Payer: Medicare Other | Source: Ambulatory Visit | Attending: Endocrinology | Admitting: Endocrinology

## 2023-03-15 DIAGNOSIS — Z1231 Encounter for screening mammogram for malignant neoplasm of breast: Secondary | ICD-10-CM

## 2023-04-07 IMAGING — MG MM DIGITAL SCREENING BILAT W/ TOMO AND CAD
8 series · 8 of 24 positions shown · non-contrast
Comparison: Previous exam(s).

CLINICAL DATA: Screening.

EXAM:
DIGITAL SCREENING BILATERAL MAMMOGRAM WITH TOMOSYNTHESIS AND CAD
TECHNIQUE: Bilateral screening digital craniocaudal and mediolateral oblique
mammograms were obtained. Bilateral screening digital breast
tomosynthesis was performed. The images were evaluated with
computer-aided detection.

[R MLO synth-2D]
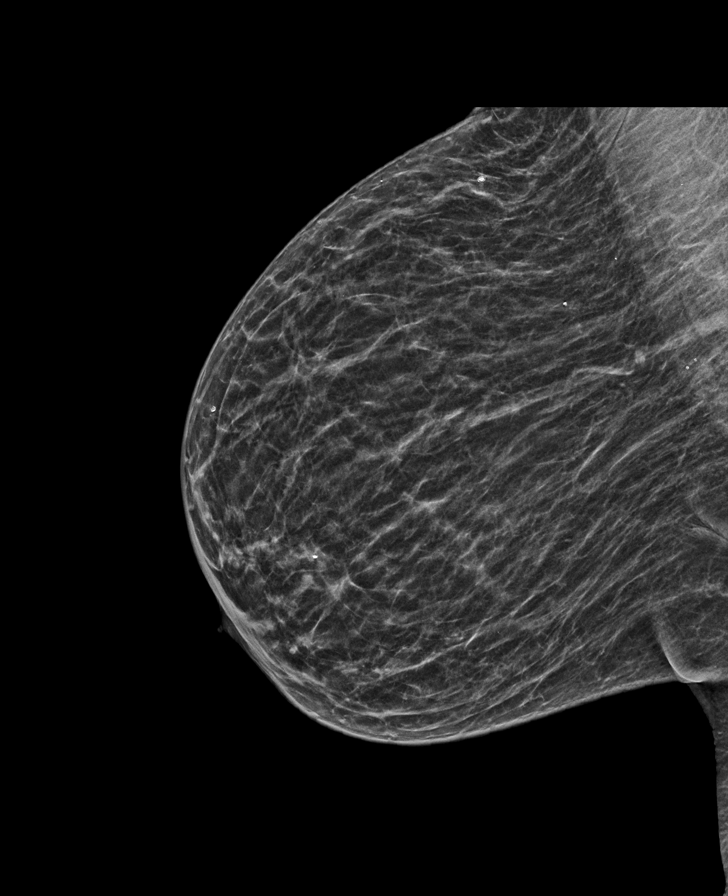

[L CC synth-2D]
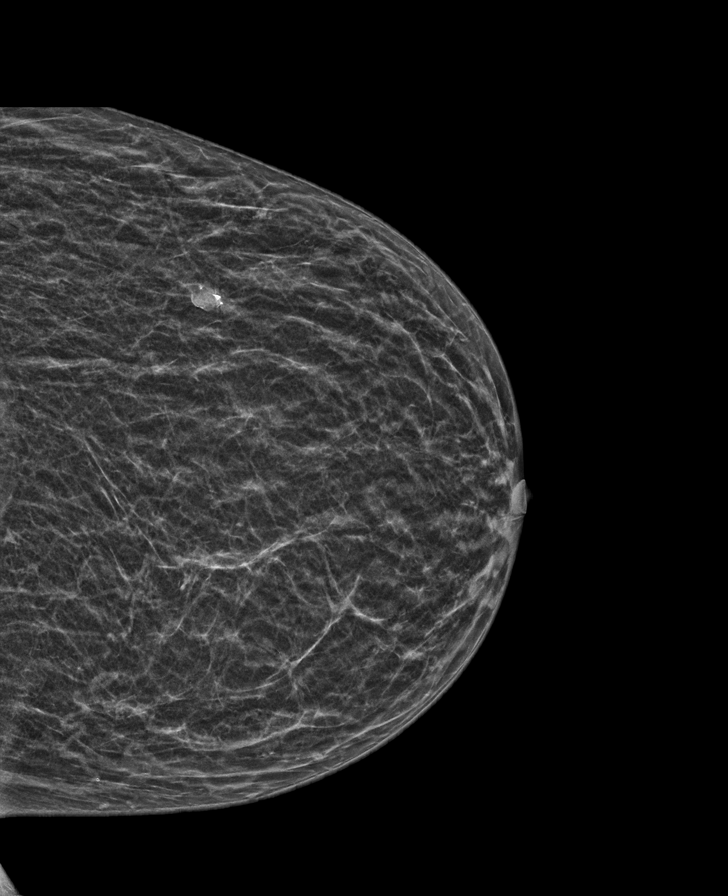

[L MLO synth-2D]
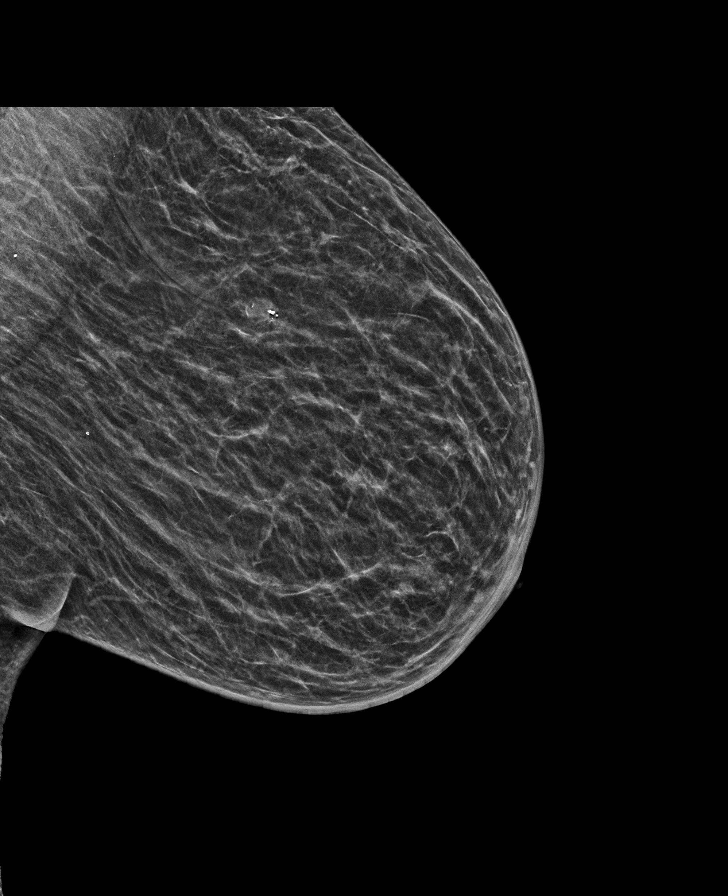

[R CC synth-2D]
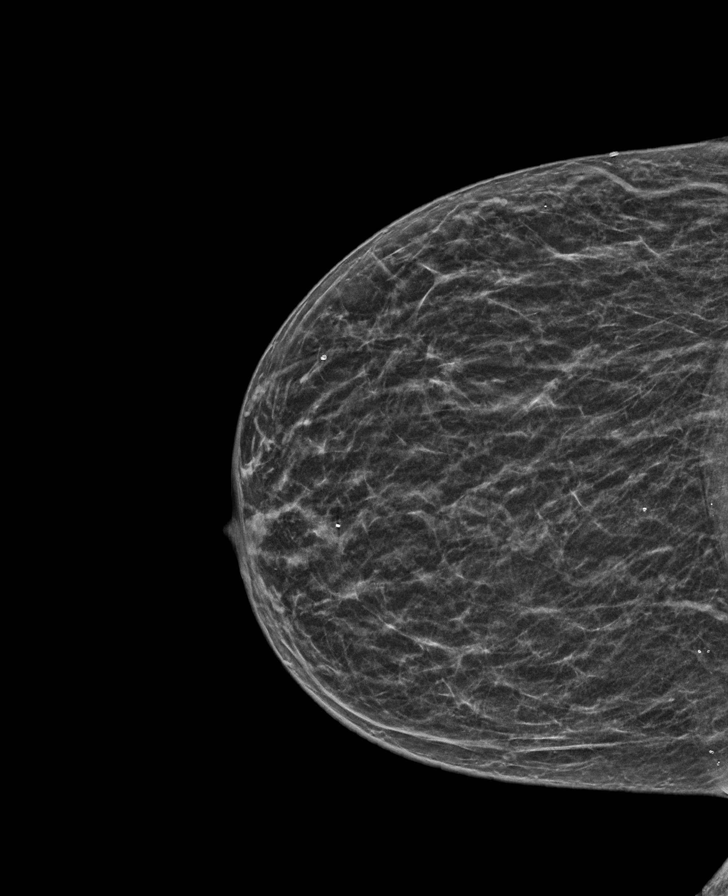

[R MLO tomo · tomo slice 24/47.0]
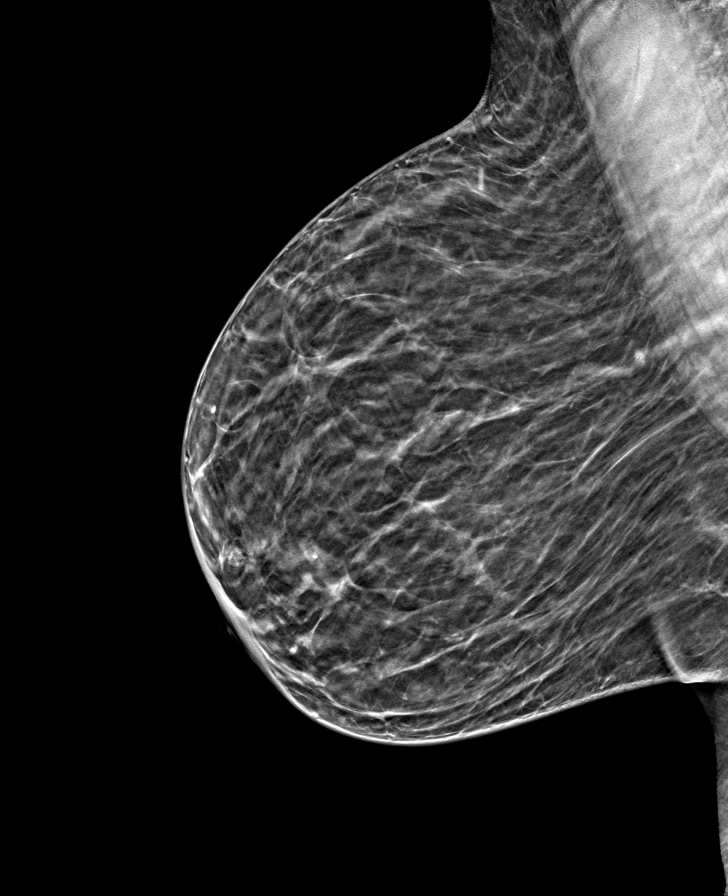

[L MLO tomo · tomo slice 24/47.0]
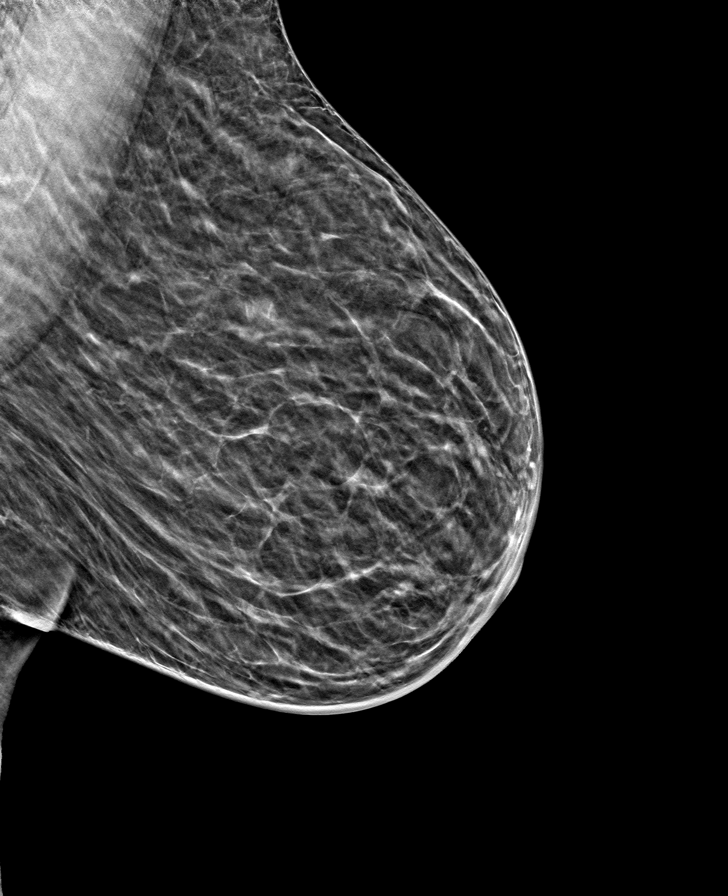

[L CC tomo · tomo slice 21/42.0]
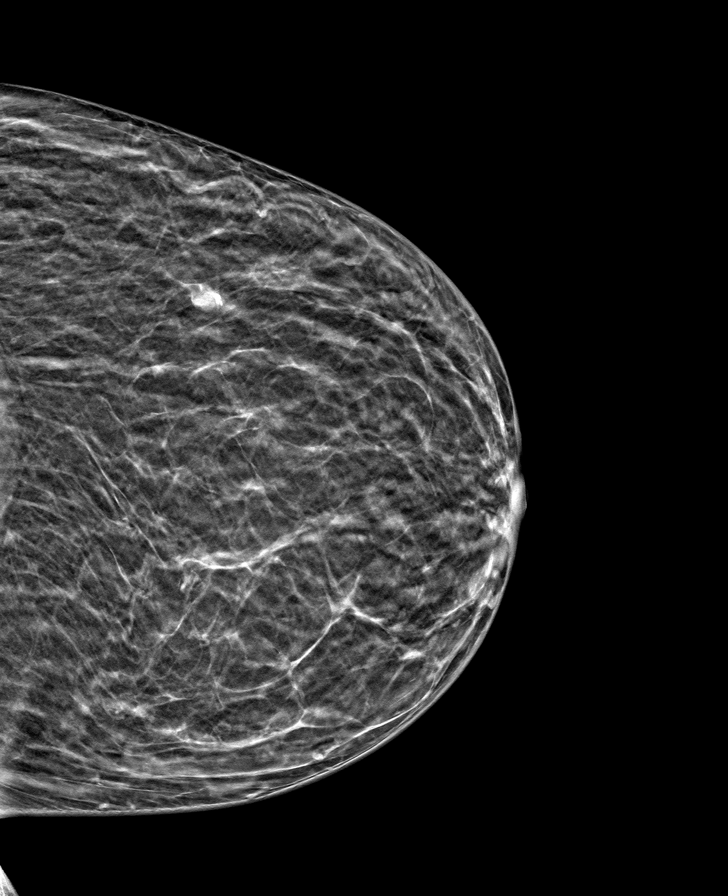

[R CC tomo · tomo slice 23/46.0]
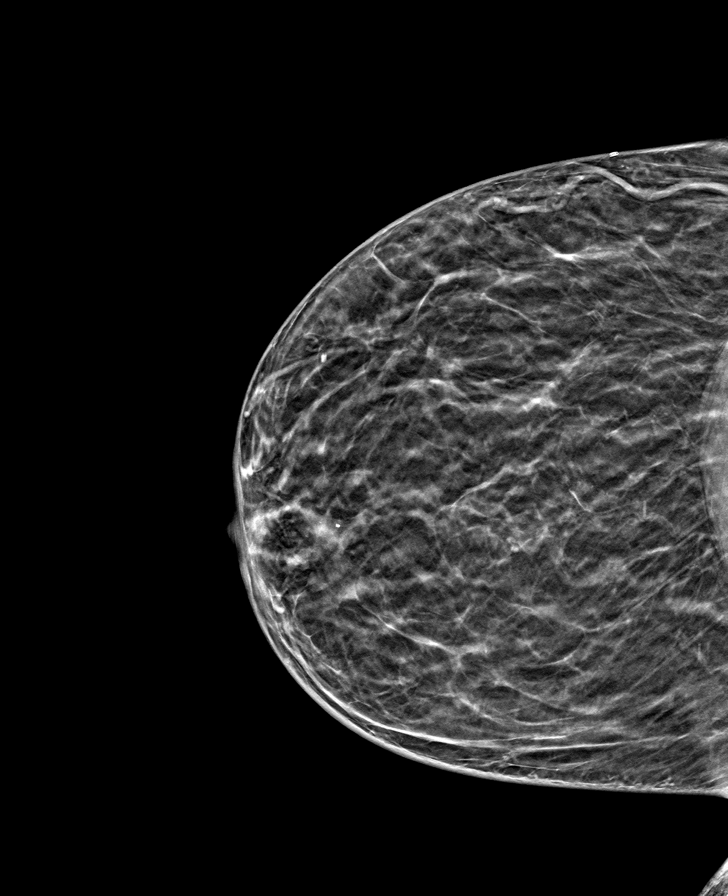

[8 of 24 positions shown; findings below may reference images not displayed]

ACR Breast Density Category b: There are scattered areas of
fibroglandular density.
FINDINGS: There are no findings suspicious for malignancy.
IMPRESSION: No mammographic evidence of malignancy. A result letter of this
screening mammogram will be mailed directly to the patient.

RECOMMENDATION:
Screening mammogram in one year. (Code:51-O-LD2)

BI-RADS CATEGORY  1: Negative.

## 2023-04-19 ENCOUNTER — Encounter: Payer: Self-pay | Admitting: Podiatry

## 2023-04-19 ENCOUNTER — Ambulatory Visit (INDEPENDENT_AMBULATORY_CARE_PROVIDER_SITE_OTHER): Payer: Medicare Other | Admitting: Podiatry

## 2023-04-19 VITALS — BP 125/64 | HR 89

## 2023-04-19 DIAGNOSIS — L909 Atrophic disorder of skin, unspecified: Secondary | ICD-10-CM

## 2023-04-19 DIAGNOSIS — L84 Corns and callosities: Secondary | ICD-10-CM

## 2023-04-19 NOTE — Progress Notes (Signed)
  Subjective:  Patient ID: Megan Bowers, female    DOB: 1956/06/27,  MRN: [id number]  Chief Complaint  Patient presents with   Callouses    "They need to be trimmed, it's time."    67 y.o. female presents with the above complaint. History confirmed with patient. Patient presenting with pain related to callus on plantar right forefoot. Patient does not have a history of T2DM with peripheral neuropathy. Patient does have callus present located at the plantar right forefoot at sub 2nd 4th and 5th met heads causing pain.   Objective:  Physical Exam: warm, good capillary refill, DP and PT pulses 2/4 bilateral nail exam normal nails without lesions DP pulses palpable, PT pulses palpable, and protective sensation intact Left Foot:  nontender to palpation, no hyperkeratotic lesions Right Foot:  Hyperkeratotic lesion present present sub 2nd 4th and 5th met heads, painful with direct palpation. Fat pad atrophy.  Assessment:   1. Callus   2. Fat pad atrophy of foot         Plan:  Patient was evaluated and treated and all questions answered.  #Hyperkeratotic lesions/pre ulcerative calluses present sub 2nd 4th and 5th met heads right forefoot All symptomatic hyperkeratoses x 3 separate lesions were safely debrided with a sterile #10 blade to patient's level of comfort without incident. We discussed preventative and palliative care of these lesions including supportive and accommodative shoegear, padding, prefabricated and custom molded accommodative orthoses, use of a pumice stone and lotions/creams daily.   Return in 3 months (on 07/20/2023) for Calluses.         Corinna Gab, DPM [facility] / [facility]

## 2023-06-07 ENCOUNTER — Telehealth: Payer: Self-pay | Admitting: Internal Medicine

## 2023-06-07 NOTE — Telephone Encounter (Signed)
Inbound call from patient requesting to discuss recall for her next colonoscopy. States she is not comfortable with her next colonoscopy being in 7 years due to precancerous polyps being found. States she would like to have her next one in 5 years instead. Patient requesting a call back to discuss further. Please advise, thank you.

## 2023-06-07 NOTE — Telephone Encounter (Signed)
Colonoscopy recall date has been updated to 01/31/28 and patient has been advised. She expresses her thanks for the call and follow up.

## 2023-06-07 NOTE — Telephone Encounter (Signed)
Dr Marina Goodell,   Patient requesting her colonoscopy recall be changed from the recommended 7 years to 5 years as she is uncomfortable waiting 7 years since she has history of precancerous colon polyps.   Please advise.Marland KitchenMarland Kitchen

## 2023-06-07 NOTE — Telephone Encounter (Signed)
The recommendation for sessile serrated polyp is 5 to 10 years.  This is based on expert guidelines.  We picked 7. If she prefers 5 years, that would be okay.  Please change recall to 5 years.  Thanks

## 2023-07-19 ENCOUNTER — Ambulatory Visit: Payer: Medicare Other | Admitting: Podiatry

## 2023-07-19 ENCOUNTER — Encounter: Payer: Self-pay | Admitting: Podiatry

## 2023-07-19 DIAGNOSIS — L84 Corns and callosities: Secondary | ICD-10-CM | POA: Diagnosis not present

## 2023-07-19 DIAGNOSIS — L909 Atrophic disorder of skin, unspecified: Secondary | ICD-10-CM | POA: Diagnosis not present

## 2023-07-19 NOTE — Progress Notes (Signed)
  Subjective:  Patient ID: Megan Bowers, female    DOB: [birth year],  MRN: [id number]  Chief Complaint  Patient presents with   Callouses    Calluses to right forefoot    67 y.o. female presents with the above complaint. History confirmed with patient. Patient presenting with pain related to callus on plantar right forefoot. Patient does not have a history of T2DM with peripheral neuropathy. Patient does have callus present located at the plantar right forefoot at sub 2nd 4th and 5th met heads causing pain.   Objective:  Physical Exam: warm, good capillary refill, DP and PT pulses 2/4 bilateral nail exam normal nails without lesions DP pulses palpable, PT pulses palpable, and protective sensation intact Left Foot:  nontender to palpation, no hyperkeratotic lesions Right Foot:  Hyperkeratotic lesion present present sub 2nd 4th and 5th met heads, painful with direct palpation. Fat pad atrophy.  Assessment:   1. Callus   2. Fat pad atrophy of foot          Plan:  Patient was evaluated and treated and all questions answered.  #Hyperkeratotic lesions/pre ulcerative calluses present sub 2nd 4th and 5th met heads right forefoot All symptomatic hyperkeratoses x 3 separate lesions were safely debrided with a sterile #10 blade to patient's level of comfort without incident. We discussed preventative and palliative care of these lesions including supportive and accommodative shoegear, padding, prefabricated and custom molded accommodative orthoses, use of a pumice stone and lotions/creams daily.         Corinna Gab, DPM [facility] / [facility]

## 2023-10-19 ENCOUNTER — Ambulatory Visit: Payer: Medicare Other | Admitting: Podiatry

## 2023-10-25 ENCOUNTER — Ambulatory Visit (INDEPENDENT_AMBULATORY_CARE_PROVIDER_SITE_OTHER): Payer: Medicare Other | Admitting: Podiatry

## 2023-10-25 ENCOUNTER — Encounter: Payer: Self-pay | Admitting: Podiatry

## 2023-10-25 DIAGNOSIS — L909 Atrophic disorder of skin, unspecified: Secondary | ICD-10-CM

## 2023-10-25 DIAGNOSIS — L84 Corns and callosities: Secondary | ICD-10-CM

## 2023-10-25 NOTE — Progress Notes (Signed)
This patient presents to the office with chief complaint of painful callus especially her right foot. This callus is painful when walking. She presents to the office every 3 months for treatment. S  Vascular  Dorsalis pedis and posterior tibial pulses are palpable  B/L.  Capillary return  WNL.  Temperature gradient is  WNL.  Skin turgor  WNL  Sensorium  Senn Weinstein monofilament wire  WNL. Normal tactile sensation.  Nail Exam  Patient has normal nails with no evidence of bacterial or fungal infection.  Orthopedic  Exam  Muscle tone and muscle strength  WNL.  No limitations of motion feet  B/L.  No crepitus or joint effusion noted.  Foot type is unremarkable and digits show no abnormalities. HAV  B/L.  Plantar flex 5th met  B/L.  Skin  No open lesions.  Normal skin texture and turgor. Callus sub 5th  B/L and sub 2 right foot.  Callus  B/L  Debride callus with # 15 blade and dremel tool.  Told her to purchase spenco insoles.  Marland Kitchengam

## 2024-01-23 ENCOUNTER — Encounter: Payer: Self-pay | Admitting: Podiatry

## 2024-01-23 ENCOUNTER — Ambulatory Visit (INDEPENDENT_AMBULATORY_CARE_PROVIDER_SITE_OTHER): Payer: Medicare Other | Admitting: Podiatry

## 2024-01-23 DIAGNOSIS — L909 Atrophic disorder of skin, unspecified: Secondary | ICD-10-CM | POA: Diagnosis not present

## 2024-01-23 DIAGNOSIS — L84 Corns and callosities: Secondary | ICD-10-CM

## 2024-01-23 DIAGNOSIS — M79671 Pain in right foot: Secondary | ICD-10-CM | POA: Diagnosis not present

## 2024-01-23 NOTE — Progress Notes (Signed)
 This patient presents to the office with chief complaint of painful callus especially her right foot. This callus is painful when walking. She presents to the office every 3 months for treatment.   Vascular  Dorsalis pedis and posterior tibial pulses are palpable  B/L.  Capillary return  WNL.  Temperature gradient is  WNL.  Skin turgor  WNL  Sensorium  Senn Weinstein monofilament wire  WNL. Normal tactile sensation.  Nail Exam  Patient has normal nails with no evidence of bacterial or fungal infection.  Orthopedic  Exam  Muscle tone and muscle strength  WNL.  No limitations of motion feet  B/L.  No crepitus or joint effusion noted.  Foot type is unremarkable and digits show no abnormalities. HAV  B/L.  Plantar flex 5th met  B/L. HT second  B/L.  Skin  No open lesions.  Normal skin texture and turgor. Callus sub sub 2 right foot.  Callus  B/L  Debride callus with # 15 blade and dremel tool.  Told her to purchase spenco insoles.  Aaron Aasgam

## 2024-01-29 ENCOUNTER — Ambulatory Visit: Payer: Medicare Other | Admitting: Podiatry

## 2024-02-05 ENCOUNTER — Other Ambulatory Visit: Payer: Self-pay | Admitting: Endocrinology

## 2024-02-05 DIAGNOSIS — Z1231 Encounter for screening mammogram for malignant neoplasm of breast: Secondary | ICD-10-CM

## 2024-03-18 ENCOUNTER — Ambulatory Visit
Admission: RE | Admit: 2024-03-18 | Discharge: 2024-03-18 | Disposition: A | Discharge status: Home / Self Care | Source: Ambulatory Visit | Attending: Endocrinology | Admitting: Endocrinology

## 2024-03-18 DIAGNOSIS — Z1231 Encounter for screening mammogram for malignant neoplasm of breast: Secondary | ICD-10-CM

## 2024-04-23 ENCOUNTER — Ambulatory Visit (INDEPENDENT_AMBULATORY_CARE_PROVIDER_SITE_OTHER): Admitting: Podiatry

## 2024-04-23 ENCOUNTER — Encounter: Payer: Self-pay | Admitting: Podiatry

## 2024-04-23 ENCOUNTER — Ambulatory Visit: Admitting: Podiatry

## 2024-04-23 DIAGNOSIS — L909 Atrophic disorder of skin, unspecified: Secondary | ICD-10-CM

## 2024-04-23 DIAGNOSIS — L84 Corns and callosities: Secondary | ICD-10-CM

## 2024-04-23 NOTE — Progress Notes (Signed)
 This patient presents to the office with chief complaint of painful callus especially her right foot. This callus is painful when walking. She presents to the office every 3 months for treatment.   Vascular  Dorsalis pedis and posterior tibial pulses are palpable  B/L.  Capillary return  WNL.  Temperature gradient is  WNL.  Skin turgor  WNL  Sensorium  Senn Weinstein monofilament wire  WNL. Normal tactile sensation.  Nail Exam  Patient has normal nails with no evidence of bacterial or fungal infection.  Orthopedic  Exam  Muscle tone and muscle strength  WNL.  No limitations of motion feet  B/L.  No crepitus or joint effusion noted.  Foot type is unremarkable and digits show no abnormalities. HAV  B/L.   HT second  B/L.  Skin  No open lesions.  Normal skin texture and turgor. Callus sub sub 2 right foot.  Callus    Debride callus with # 15 blade and dremel tool.  Spenco insoles are helping.   Cordella Bold DPM

## 2024-07-23 ENCOUNTER — Encounter: Payer: Self-pay | Admitting: Podiatry

## 2024-07-23 ENCOUNTER — Ambulatory Visit (INDEPENDENT_AMBULATORY_CARE_PROVIDER_SITE_OTHER): Admitting: Podiatry

## 2024-07-23 DIAGNOSIS — M79674 Pain in right toe(s): Secondary | ICD-10-CM

## 2024-07-23 DIAGNOSIS — M79675 Pain in left toe(s): Secondary | ICD-10-CM

## 2024-07-23 DIAGNOSIS — B351 Tinea unguium: Secondary | ICD-10-CM | POA: Diagnosis not present

## 2024-07-23 NOTE — Progress Notes (Signed)
 This patient presents to the office with chief complaint of painful big toenails   She says she has no pain related to her  calluses.  She presents to the office every 4  months for treatment.   Vascular  Dorsalis pedis and posterior tibial pulses are palpable  B/L.  Capillary return  WNL.  Temperature gradient is  WNL.  Skin turgor  WNL  Sensorium  Senn Weinstein monofilament wire  WNL. Normal tactile sensation.  Nail Exam  Patient has thick disfigured great toenails both feet. no evidence of bacterial or fungal infection.  Orthopedic  Exam  Muscle tone and muscle strength  WNL.  No limitations of motion feet  B/L.  No crepitus or joint effusion noted.  Foot type is unremarkable and digits show no abnormalities. HAV  B/L.   HT second  B/L.  Skin  No open lesions.  Normal skin texture and turgor. Asymptomatic  callus right foot.  Onychomycosis  B/L.   Debride nails with nail nipper and dremel tool.  RTC 4 months    Cordella Bold DPM

## 2024-11-26 ENCOUNTER — Ambulatory Visit: Admitting: Podiatry
